# Patient Record
Sex: Female | Born: 1990 | Race: Black or African American | Hispanic: No | Marital: Single | Smoking: Never smoker
Health system: Southern US, Community
[De-identification: ages and names within clinical notes are randomized; demographics above are authoritative.]

## PROBLEM LIST (undated history)

## (undated) ENCOUNTER — Emergency Department (HOSPITAL_COMMUNITY): Admission: EM | Payer: Self-pay | Source: Home / Self Care

## (undated) ENCOUNTER — Emergency Department (HOSPITAL_COMMUNITY): Discharge status: Against Medical Advice | Payer: Self-pay

## (undated) DIAGNOSIS — Z973 Presence of spectacles and contact lenses: Secondary | ICD-10-CM

## (undated) DIAGNOSIS — L932 Other local lupus erythematosus: Secondary | ICD-10-CM

## (undated) HISTORY — DX: Presence of spectacles and contact lenses: Z97.3

---
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
## 2009-06-26 ENCOUNTER — Emergency Department (HOSPITAL_COMMUNITY): Admission: EM | Admit: 2009-06-26 | Discharge: 2009-06-26 | Payer: Self-pay | Admitting: Family Medicine

## 2011-12-23 ENCOUNTER — Encounter (HOSPITAL_COMMUNITY): Payer: Self-pay | Admitting: Emergency Medicine

## 2011-12-23 ENCOUNTER — Emergency Department (HOSPITAL_COMMUNITY)
Admission: EM | Admit: 2011-12-23 | Discharge: 2011-12-24 | Disposition: A | Discharge status: Home / Self Care | Payer: Self-pay | Attending: Emergency Medicine | Admitting: Emergency Medicine

## 2011-12-23 DIAGNOSIS — R200 Anesthesia of skin: Secondary | ICD-10-CM

## 2011-12-23 DIAGNOSIS — R209 Unspecified disturbances of skin sensation: Secondary | ICD-10-CM | POA: Insufficient documentation

## 2011-12-23 HISTORY — DX: Other local lupus erythematosus: L93.2

## 2011-12-23 NOTE — ED Provider Notes (Signed)
History     CSN: [id number]  Arrival date & time 12/23/11  2105   First MD Initiated Contact with Patient 12/23/11 2232      Chief Complaint  Patient presents with  . Numbness    (Consider location/radiation/quality/duration/timing/severity/associated sxs/prior treatment) HPI Patient presents emergency Dept. with a small patch of numbness to her medial calf.  Patient, states that last night she felt like her foot fell asleep and since that time she states she has had some intermittent pain, but this time has no pain. The patient states that the area feels swollen. The patient denies redness, calf pain, N/V, weakness, abd pain, CP, or SOB. Past Medical History  Diagnosis Date  . Cutaneous lupus erythematosus     History reviewed. No pertinent past surgical history.  No family history on file.  History  Substance Use Topics  . Smoking status: Never Smoker   . Smokeless tobacco: Not on file  . Alcohol Use: Yes    OB History    Grav Para Term Preterm Abortions TAB SAB Ect Mult Living                  Review of Systems All other systems negative except as documented in the HPI. All pertinent positives and negatives as reviewed in the HPI.  Allergies  Review of patient's allergies indicates no known allergies.  Home Medications   Current Outpatient Rx  Name Route Sig Dispense Refill  . HYDROXYCHLOROQUINE SULFATE 200 MG PO TABS Oral Take by mouth daily.    Marland Kitchen MEDROXYPROGESTERONE ACETATE 150 MG/ML IM SUSP Intramuscular Inject 150 mg into the muscle every 3 (three) months.      BP 130/82  Pulse 89  Temp(Src) 98.4 F (36.9 C) (Oral)  Resp 16  Wt 160 lb (72.576 kg)  SpO2 100%  Physical Exam  Constitutional: She appears well-developed and well-nourished.  Cardiovascular: Normal rate, regular rhythm and normal heart sounds.   Pulmonary/Chest: Effort normal and breath sounds normal.  Musculoskeletal:       Right lower leg: She exhibits no tenderness, no swelling, no  edema and no deformity.       Legs: Skin: Skin is warm and dry.    ED Course  Procedures (including critical care time) Patient does not have any signs of DVT on exam.  There is a small patch of numbness in the medial lower leg.The patient has no gait disturbance, weakness, or back pain.  This a peripheral nerve issue rather than a central issue.    MDM          Carlyle Dolly, PA-C 12/23/11 2354

## 2011-12-23 NOTE — Discharge Instructions (Signed)
This numbness will most likely resolve. If not you can follow up with the neurologist provided.  Return here as needed.

## 2011-12-23 NOTE — ED Notes (Signed)
Per pt: right foot fell asleep last night and pt has had intermittent pain and numbness to right calf which appears slightly swollen per pt. No changes in breathing or chest pain. No known activity or injury cause. Pt on Depo shots. Ambulating wnl.

## 2011-12-24 NOTE — ED Provider Notes (Signed)
Medical screening examination/treatment/procedure(s) were performed by non-physician practitioner and as supervising physician I was immediately available for consultation/collaboration.   Lyanne Co, MD 12/24/11 0020

## 2012-12-16 ENCOUNTER — Other Ambulatory Visit (HOSPITAL_COMMUNITY)
Admission: RE | Admit: 2012-12-16 | Discharge: 2012-12-16 | Disposition: A | Discharge status: Home / Self Care | Payer: Self-pay | Source: Ambulatory Visit | Attending: Medical | Admitting: Medical

## 2012-12-16 ENCOUNTER — Encounter: Payer: Self-pay | Admitting: Medical

## 2012-12-16 ENCOUNTER — Ambulatory Visit (INDEPENDENT_AMBULATORY_CARE_PROVIDER_SITE_OTHER): Payer: BC Managed Care – PPO | Admitting: Medical

## 2012-12-16 ENCOUNTER — Telehealth: Payer: Self-pay | Admitting: Family Medicine

## 2012-12-16 VITALS — BP 100/80 | HR 80 | Temp 98.2°F | Resp 16 | Ht 61.0 in | Wt 163.0 lb

## 2012-12-16 DIAGNOSIS — Z01419 Encounter for gynecological examination (general) (routine) without abnormal findings: Secondary | ICD-10-CM | POA: Insufficient documentation

## 2012-12-16 DIAGNOSIS — Z23 Encounter for immunization: Secondary | ICD-10-CM

## 2012-12-16 DIAGNOSIS — Z0000 Encounter for general adult medical examination without abnormal findings: Secondary | ICD-10-CM

## 2012-12-16 DIAGNOSIS — Z113 Encounter for screening for infections with a predominantly sexual mode of transmission: Secondary | ICD-10-CM | POA: Insufficient documentation

## 2012-12-16 DIAGNOSIS — Z309 Encounter for contraceptive management, unspecified: Secondary | ICD-10-CM

## 2012-12-16 DIAGNOSIS — Z124 Encounter for screening for malignant neoplasm of cervix: Secondary | ICD-10-CM

## 2012-12-16 LAB — CBC WITH DIFFERENTIAL/PLATELET
Basophils Absolute: 0 10*3/uL (ref 0.0–0.1)
Basophils Relative: 0 % (ref 0–1)
HCT: 40.6 % (ref 36.0–46.0)
Lymphocytes Relative: 43 % (ref 12–46)
MCHC: 34.5 g/dL (ref 30.0–36.0)
Monocytes Absolute: 0.5 10*3/uL (ref 0.1–1.0)
Neutro Abs: 3.7 10*3/uL (ref 1.7–7.7)
Neutrophils Relative %: 49 % (ref 43–77)
RDW: 14 % (ref 11.5–15.5)
WBC: 7.4 10*3/uL (ref 4.0–10.5)

## 2012-12-16 LAB — POCT URINE PREGNANCY: Preg Test, Ur: NEGATIVE

## 2012-12-16 LAB — POCT URINALYSIS DIPSTICK
Bilirubin, UA: NEGATIVE
Glucose, UA: NEGATIVE
Ketones, UA: NEGATIVE
pH, UA: 5

## 2012-12-16 MED ORDER — MEDROXYPROGESTERONE ACETATE 150 MG/ML IM SUSP
150.0000 mg | Freq: Once | INTRAMUSCULAR | Status: AC
  Administered 2012-12-16: 150 mg via INTRAMUSCULAR

## 2012-12-16 NOTE — Progress Notes (Signed)
Subjective:   HPI  Amanda Wolfe is a 22 y.o. female who presents for a complete physical.  New patient today.   Healthy feeling, no major issues.   Preventative care: Last ophthalmology visit:yes- Dr. Broadus John Last dental visit:n/a Last colonoscopy:n/a Last mammogram:n/a Last gynecological exam:may 2013 Last EKG:n/a Last labs:n/a  Prior vaccinations: TD or Tdap:2009 Influenza:n/a Pneumococcal:n/a Shingles/Zostavax:n/a Advanced directive:n/a Health care power of attorney:n/a  Concerns: Pap last 5/13, normal per pt.  Sexually active, 1 partner, monogamous, is agreeable to STD screening today.   Has hx/o 1 HPV vaccine.  Was seeing health dept prior.  Wants to c/t Depo Provera for now, but is considering getting off OCPs for a while.   Reviewed their medical, surgical, family, social, medication, and allergy history and updated chart as appropriate.   Past Medical History  Diagnosis Date  . Cutaneous lupus erythematosus     Dr. Terri Piedra, Dermatology  . Wears contact lenses     No past surgical history on file.  Family History  Problem Relation Age of Onset  . Hypertension Father   . Stroke Father   . Diabetes Maternal Grandfather   . Heart disease Neg Hx     History   Social History  . Marital Status: Single    Spouse Name: N/A    Number of Children: N/A  . Years of Education: N/A   Occupational History  . Not on file.   Social History Main Topics  . Smoking status: Never Smoker   . Smokeless tobacco: Not on file  . Alcohol Use: Yes     Comment: occasional  . Drug Use: No  . Sexually Active: Not Currently   Other Topics Concern  . Not on file   Social History Narrative   Lives with mother , brother, sister, works at [employer], in school at [employer], plans to attend [employer] for social work, exercises 3 days per week, walking, running    Current Outpatient Prescriptions on File Prior to Visit  Medication Sig Dispense Refill  . hydroxychloroquine  (PLAQUENIL) 200 MG tablet Take by mouth daily.      . medroxyPROGESTERone (DEPO-PROVERA) 150 MG/ML injection Inject 150 mg into the muscle every 3 (three) months.       No current facility-administered medications on file prior to visit.    No Known Allergies   Review of Systems Constitutional: -fever, -chills, -sweats, -unexpected weight change, -decreased appetite, -fatigue Allergy: -sneezing, -itching, -congestion Dermatology: -changing moles, --rash, -lumps ENT: -runny nose, -ear pain, -sore throat, -hoarseness, -sinus pain, -teeth pain, - ringing in ears, -hearing loss, -nosebleeds Cardiology: -chest pain, -palpitations, -swelling, -difficulty breathing when lying flat, -waking up short of breath Respiratory: -cough, -shortness of breath, -difficulty breathing with exercise or exertion, -wheezing, -coughing up blood Gastroenterology: -abdominal pain, -nausea, -vomiting, -diarrhea, -constipation, -blood in stool, -changes in bowel movement, -difficulty swallowing or eating Hematology: -bleeding, -bruising  Musculoskeletal: -joint aches, -muscle aches, -joint swelling, -back pain, -neck pain, -cramping, -changes in gait Ophthalmology: denies vision changes, eye redness, itching, discharge Urology: -burning with urination, -difficulty urinating, -blood in urine, -urinary frequency, -urgency, -incontinence Neurology: -headache, -weakness, -tingling, -numbness, -memory loss, -falls, -dizziness Psychology: -depressed mood, -agitation, -sleep problems     Objective:   Physical Exam  Nurse notes and vitals reviewed   General appearance: alert, no distress, WD/WN, AA female Skin: right cheek with irregular shape pink coloration different from surrounding skin - pt notes is her cutaneous lupus rash HEENT: normocephalic, conjunctiva/corneas normal, sclerae anicteric, PERRLA, EOMi,  nares patent, no discharge or erythema, pharynx normal Oral cavity: MMM, tongue normal, teeth in good  repair Neck: supple, no lymphadenopathy, no thyromegaly, no masses, normal ROM, no bruits Chest: non tender, normal shape and expansion Heart: RRR, normal S1, S2, no murmurs Lungs: CTA bilaterally, no wheezes, rhonchi, or rales Abdomen: +bs, soft, non tender, non distended, no masses, no hepatomegaly, no splenomegaly, no bruits Back: non tender, normal ROM, no scoliosis Musculoskeletal: upper extremities non tender, no obvious deformity, normal ROM throughout, lower extremities non tender, no obvious deformity, normal ROM throughout Extremities: no edema, no cyanosis, no clubbing Pulses: 2+ symmetric, upper and lower extremities, normal cap refill Neurological: alert, oriented x 3, CN2-12 intact, strength normal upper extremities and lower extremities, sensation normal throughout, DTRs 2+ throughout, no cerebellar signs, gait normal Psychiatric: normal affect, behavior normal, pleasant  Breast: nontender, no masses or lumps, no skin changes, no nipple discharge or inversion, no axillary lymphadenopathy Gyn: Normal external genitalia without lesions, vagina with normal mucosa, cervix without lesions, no cervical motion tenderness, no abnormal vaginal discharge.  Uterus and adnexa not enlarged, nontender, no masses.  Pap performed.  Exam chaperoned by nurse. Rectal: deferred    Assessment and Plan :    Encounter Diagnoses  Name Primary?  . Routine general medical examination at a health care facility Yes  . Need for HPV vaccination   . Contraception management   . Screen for STD (sexually transmitted disease)   . Screening for cervical cancer     Physical exam - discussed healthy lifestyle, diet, exercise, preventative care, vaccinations, and addressed their concerns.  Handout given.  HPV vaccine, counseling and VIS given  Contraception management - c/t Depo Provera for now.  Discussed options, pros/cons of medication.   Discussed safe sex, condom use.  STD screening today  Pap  sent, will request prior pap through Health Dept.  Follow-up pending labs

## 2012-12-16 NOTE — Patient Instructions (Signed)

## 2012-12-16 NOTE — Addendum Note (Signed)
Addended by: Janeice Robinson on: 12/16/2012 01:45 PM   Modules accepted: Orders

## 2012-12-16 NOTE — Telephone Encounter (Signed)
Message copied by Janeice Robinson on Mon Dec 16, 2012  3:56 PM ------      Message from: Aleen Campi, DAVID S      Created: Mon Dec 16, 2012 11:37 AM       pls give info for dentist            Dr. Yancey Flemings, dentist      [address], [place], [place] [address]      [phone]      [email or web address]                   ------

## 2012-12-16 NOTE — Telephone Encounter (Signed)
I mailed the patient a copy of the dentist information. CLS

## 2012-12-17 LAB — COMPREHENSIVE METABOLIC PANEL
ALT: 20 U/L (ref 0–35)
AST: 16 U/L (ref 0–37)
Albumin: 4.8 g/dL (ref 3.5–5.2)
BUN: 10 mg/dL (ref 6–23)
CO2: 23 mEq/L (ref 19–32)
Calcium: 10.1 mg/dL (ref 8.4–10.5)
Chloride: 107 mEq/L (ref 96–112)
Creat: 0.85 mg/dL (ref 0.50–1.10)
Potassium: 4 mEq/L (ref 3.5–5.3)

## 2012-12-17 LAB — LIPID PANEL
Cholesterol: 122 mg/dL (ref 0–200)
HDL: 42 mg/dL (ref >39)
Triglycerides: 56 mg/dL (ref <150)

## 2012-12-17 LAB — TSH: TSH: 2.412 u[IU]/mL (ref 0.350–4.500)

## 2012-12-18 ENCOUNTER — Encounter: Payer: Self-pay | Admitting: Family Medicine

## 2012-12-18 LAB — URINE CULTURE
Colony Count: NO GROWTH
Organism ID, Bacteria: NO GROWTH

## 2012-12-20 ENCOUNTER — Encounter: Payer: Self-pay | Admitting: Family Medicine

## 2012-12-27 ENCOUNTER — Telehealth: Payer: Self-pay | Admitting: Medical

## 2012-12-27 ENCOUNTER — Other Ambulatory Visit: Payer: Self-pay | Admitting: Medical

## 2012-12-27 DIAGNOSIS — L932 Other local lupus erythematosus: Secondary | ICD-10-CM

## 2012-12-27 NOTE — Telephone Encounter (Signed)
That is fine, I put the order in as standing

## 2012-12-27 NOTE — Telephone Encounter (Signed)
CALLED PT TO ADVISE THAT SHE CAN HAVE TEST DONE IN OUR OFFICE. SHE WILL PLAN TO HAVE THIS DONE ON TUES 6/17

## 2012-12-31 ENCOUNTER — Other Ambulatory Visit (INDEPENDENT_AMBULATORY_CARE_PROVIDER_SITE_OTHER): Payer: BC Managed Care – PPO

## 2012-12-31 DIAGNOSIS — L932 Other local lupus erythematosus: Secondary | ICD-10-CM

## 2012-12-31 DIAGNOSIS — R82998 Other abnormal findings in urine: Secondary | ICD-10-CM

## 2012-12-31 DIAGNOSIS — R8290 Unspecified abnormal findings in urine: Secondary | ICD-10-CM

## 2012-12-31 LAB — POCT URINALYSIS DIPSTICK
Bilirubin, UA: NEGATIVE
Nitrite, UA: POSITIVE
pH, UA: 6

## 2013-01-01 LAB — URINE CULTURE

## 2013-01-01 LAB — ANA: Anti Nuclear Antibody(ANA): NEGATIVE

## 2013-01-02 ENCOUNTER — Other Ambulatory Visit: Payer: Self-pay | Admitting: Medical

## 2013-01-02 MED ORDER — CIPROFLOXACIN HCL 250 MG PO TABS: 250.0000 mg | ORAL_TABLET | Freq: Two times a day (BID) | ORAL | Status: DC

## 2013-02-05 ENCOUNTER — Telehealth: Payer: Self-pay | Admitting: Medical

## 2013-02-05 NOTE — Telephone Encounter (Signed)
Rcd copy of fax which is an out of work note.  This is not ours and our PA did not sign and pt was not here on that day.  Pt will be dismissed.  Copy scanned into media.  Kristian Covey PA is aware.

## 2013-02-05 NOTE — Telephone Encounter (Signed)
TSD  

## 2013-02-10 ENCOUNTER — Encounter: Payer: Self-pay | Admitting: Family Medicine

## 2014-12-22 ENCOUNTER — Other Ambulatory Visit: Payer: Self-pay | Admitting: Obstetrics & Gynecology

## 2015-08-04 ENCOUNTER — Other Ambulatory Visit: Payer: Self-pay | Admitting: Obstetrics and Gynecology

## 2017-04-03 ENCOUNTER — Ambulatory Visit (HOSPITAL_COMMUNITY)
Admission: EM | Admit: 2017-04-03 | Discharge: 2017-04-03 | Disposition: A | Discharge status: Home / Self Care | Payer: BLUE CROSS/BLUE SHIELD | Attending: Family Medicine | Admitting: Family Medicine

## 2017-04-03 ENCOUNTER — Encounter (HOSPITAL_COMMUNITY): Payer: Self-pay | Admitting: Emergency Medicine

## 2017-04-03 DIAGNOSIS — M79672 Pain in left foot: Secondary | ICD-10-CM

## 2017-04-03 MED ORDER — NAPROXEN 500 MG PO TABS: 500.0000 mg | ORAL_TABLET | Freq: Two times a day (BID) | ORAL | 0 refills | Status: DC

## 2017-04-03 NOTE — ED Provider Notes (Signed)
[facility]    CSN: [id number] Arrival date & time: 04/03/17  1539     History   Chief Complaint Chief Complaint  Patient presents with  . Foot Pain    HPI Amanda Wolfe is a 26 y.o. female.   26 year old female comes in for 2 week history of left foot pain. Patient states it started off mild, on the top of her foot, and was intermittent. Pain has slowly increased, and is now painful to bear weight. Noticed some swelling this morning. She has been using ice compress with good relief. Patient states she has had a recent change in activity, going back to school with lots of walking. 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 Denies injury, trauma, no numbness, tingling. Has not taken anything for the pain. She notices that the pain is worse first thing in the morning/after rest, and is improved from with some movement. Denies spreading erythema, increased warmth.      Past Medical History:  Diagnosis Date  . Cutaneous lupus erythematosus    Dr. Terri Piedra, Dermatology  . Wears contact lenses     There are no active problems to display for this patient.   History reviewed. No pertinent surgical history.  OB History    No data available       Home Medications    Prior to Admission medications   Medication Sig Start Date End Date Taking? Authorizing Provider  hydroxychloroquine (PLAQUENIL) 200 MG tablet Take by mouth daily.    [provider]  medroxyPROGESTERone (DEPO-PROVERA) 150 MG/ML injection Inject 150 mg into the muscle every 3 (three) months.    [provider]  naproxen (NAPROSYN) 500 MG tablet Take 1 tablet (500 mg total) by mouth 2 (two) times daily. 04/03/17   Belinda Fisher, PA-C    Family History Family History  Problem Relation Age of Onset  . Hypertension Father   . Stroke Father   . Diabetes Maternal Grandfather   . Heart disease Neg Hx     Social History Social History    Substance Use Topics  . Smoking status: Never Smoker  . Smokeless tobacco: Never Used  . Alcohol use Yes     Comment: occasional     Allergies   Patient has no known allergies.   Review of Systems Review of Systems  Reason unable to perform ROS: See HPI as above.     Physical Exam Triage Vital Signs ED Triage Vitals  Enc Vitals Group     BP 04/03/17 1615 115/79     Pulse Rate 04/03/17 1615 88     Resp 04/03/17 1615 16     Temp 04/03/17 1615 99 F (37.2 C)     Temp Source 04/03/17 1615 Oral     SpO2 04/03/17 1615 99 %     Weight 04/03/17 1614 185 lb (83.9 kg)     Height 04/03/17 1614  (1.499 m)     Head Circumference --      Peak Flow --      Pain Score 04/03/17 1615 4     Pain Loc --      Pain Edu? --      Excl. in GC? --    No data found.   Updated Vital Signs BP 115/79   Pulse 88   Temp 99 F (37.2 C) (Oral)   Resp 16   Ht  (1.499 m)  Wt 185 lb (83.9 kg)   SpO2 99%   BMI 37.37 kg/m    Physical Exam  Constitutional: She is oriented to person, place, and time. She appears well-developed and well-nourished. No distress.  HENT:  Head: Normocephalic and atraumatic.  Eyes: Pupils are equal, round, and reactive to light. Conjunctivae are normal.  Musculoskeletal:  No swelling, wound, erythema noted. No increased warmth. Tenderness on palpation of left dorsal foot, third to fourth proximal metatarsal. Extension of toes increases the pain. Full range of motion of ankle and toes. Strength normal and equal bilaterally. Sensation intact and equal bilaterally. Pedal pulses 2+ and equal bilaterally. Cap refill unable to assess due to nail polish.  Neurological: She is alert and oriented to person, place, and time.     UC Treatments / Results  Labs (all labs ordered are listed, but only abnormal results are displayed) Labs Reviewed - No data to display  EKG  EKG Interpretation None       Radiology No results  found.  Procedures Procedures (including critical care time)  Medications Ordered in UC Medications - No data to display   Initial Impression / Assessment and Plan / UC Course  I have reviewed the triage vital signs and the nursing notes.  Pertinent labs & imaging results that were available during my care of the patient were reviewed by me and considered in my medical decision making (see chart for details).    Discussed with patient symptoms could be due to tendinitis given increased activity. Symptomatic treatment. Return precautions given.   Final Clinical Impressions(s) / UC Diagnoses   Final diagnoses:  Foot pain, left    New Prescriptions New Prescriptions   NAPROXEN (NAPROSYN) 500 MG TABLET    Take 1 tablet (500 mg total) by mouth 2 (two) times daily.      Belinda Fisher, PA-C 04/03/17 [phone]

## 2017-04-03 NOTE — ED Triage Notes (Signed)
PT reports left foot pain increasing over last two weeks. No injury. Increased walking.

## 2017-04-03 NOTE — Discharge Instructions (Signed)
Start Naproxen as directed. Ice/heat compresses and elevation. Wear supportive shoes. This can take up to 3-4 weeks to completely resolve, but you should be feeling better each week. Follow up here or with PCP if symptoms worsen, changes for reevaluation.

## 2018-09-19 ENCOUNTER — Encounter (INDEPENDENT_AMBULATORY_CARE_PROVIDER_SITE_OTHER): Payer: BLUE CROSS/BLUE SHIELD | Admitting: Ophthalmology

## 2018-09-19 DIAGNOSIS — H5213 Myopia, bilateral: Secondary | ICD-10-CM

## 2018-09-19 DIAGNOSIS — H43813 Vitreous degeneration, bilateral: Secondary | ICD-10-CM | POA: Diagnosis not present

## 2018-09-19 DIAGNOSIS — H4421 Degenerative myopia, right eye: Secondary | ICD-10-CM | POA: Diagnosis not present

## 2018-11-07 ENCOUNTER — Encounter: Payer: Self-pay | Admitting: Medical

## 2020-05-20 ENCOUNTER — Other Ambulatory Visit: Payer: Self-pay

## 2020-05-20 ENCOUNTER — Emergency Department (HOSPITAL_COMMUNITY): Payer: BC Managed Care – PPO

## 2020-05-20 ENCOUNTER — Inpatient Hospital Stay (HOSPITAL_COMMUNITY)
Admission: EM | Admit: 2020-05-20 | Discharge: 2020-05-22 | DRG: 872 | Disposition: A | Discharge status: Home / Self Care | Payer: BC Managed Care – PPO | Attending: Internal Medicine | Admitting: Internal Medicine

## 2020-05-20 DIAGNOSIS — J0300 Acute streptococcal tonsillitis, unspecified: Secondary | ICD-10-CM | POA: Diagnosis present

## 2020-05-20 DIAGNOSIS — L930 Discoid lupus erythematosus: Secondary | ICD-10-CM | POA: Diagnosis present

## 2020-05-20 DIAGNOSIS — R6520 Severe sepsis without septic shock: Secondary | ICD-10-CM | POA: Diagnosis present

## 2020-05-20 DIAGNOSIS — A400 Sepsis due to streptococcus, group A: Secondary | ICD-10-CM | POA: Diagnosis not present

## 2020-05-20 DIAGNOSIS — D72829 Elevated white blood cell count, unspecified: Secondary | ICD-10-CM | POA: Diagnosis not present

## 2020-05-20 DIAGNOSIS — A419 Sepsis, unspecified organism: Secondary | ICD-10-CM | POA: Diagnosis present

## 2020-05-20 DIAGNOSIS — J039 Acute tonsillitis, unspecified: Secondary | ICD-10-CM | POA: Diagnosis not present

## 2020-05-20 DIAGNOSIS — Z20822 Contact with and (suspected) exposure to covid-19: Secondary | ICD-10-CM | POA: Diagnosis present

## 2020-05-20 DIAGNOSIS — Z6841 Body Mass Index (BMI) 40.0 and over, adult: Secondary | ICD-10-CM

## 2020-05-20 DIAGNOSIS — J020 Streptococcal pharyngitis: Secondary | ICD-10-CM

## 2020-05-20 DIAGNOSIS — R7989 Other specified abnormal findings of blood chemistry: Secondary | ICD-10-CM | POA: Diagnosis not present

## 2020-05-20 LAB — I-STAT BETA HCG BLOOD, ED (MC, WL, AP ONLY): I-stat hCG, quantitative: <5 m[IU]/mL (ref <5)

## 2020-05-20 LAB — COMPREHENSIVE METABOLIC PANEL
ALT: 42 U/L (ref 0–44)
AST: 31 U/L (ref 15–41)
Albumin: 4.6 g/dL (ref 3.5–5.0)
Alkaline Phosphatase: 58 U/L (ref 38–126)
Anion gap: 14 (ref 5–15)
BUN: 10 mg/dL (ref 6–20)
CO2: 23 mmol/L (ref 22–32)
Calcium: 9.1 mg/dL (ref 8.9–10.3)
Chloride: 98 mmol/L (ref 98–111)
Creatinine, Ser: 1.08 mg/dL — ABNORMAL HIGH (ref 0.44–1.00)
GFR, Estimated: >60 mL/min (ref >60)
Glucose, Bld: 132 mg/dL — ABNORMAL HIGH (ref 70–99)
Potassium: 3.6 mmol/L (ref 3.5–5.1)
Sodium: 135 mmol/L (ref 135–145)
Total Bilirubin: 3.6 mg/dL — ABNORMAL HIGH (ref 0.3–1.2)
Total Protein: 8.5 g/dL — ABNORMAL HIGH (ref 6.5–8.1)

## 2020-05-20 LAB — CBC WITH DIFFERENTIAL/PLATELET
Abs Immature Granulocytes: 0.11 10*3/uL — ABNORMAL HIGH (ref 0.00–0.07)
Basophils Absolute: 0 10*3/uL (ref 0.0–0.1)
Basophils Relative: 0 %
Eosinophils Absolute: 0 10*3/uL (ref 0.0–0.5)
Eosinophils Relative: 0 %
HCT: 41.8 % (ref 36.0–46.0)
Hemoglobin: 14.4 g/dL (ref 12.0–15.0)
Immature Granulocytes: 1 %
Lymphocytes Relative: 2 %
Lymphs Abs: 0.4 10*3/uL — ABNORMAL LOW (ref 0.7–4.0)
MCH: 29.8 pg (ref 26.0–34.0)
MCHC: 34.4 g/dL (ref 30.0–36.0)
MCV: 86.4 fL (ref 80.0–100.0)
Monocytes Absolute: 1.2 10*3/uL — ABNORMAL HIGH (ref 0.1–1.0)
Monocytes Relative: 7 %
Neutro Abs: 15.3 10*3/uL — ABNORMAL HIGH (ref 1.7–7.7)
Neutrophils Relative %: 90 %
Platelets: 247 10*3/uL (ref 150–400)
RBC: 4.84 MIL/uL (ref 3.87–5.11)
RDW: 13.2 % (ref 11.5–15.5)
WBC: 17 10*3/uL — ABNORMAL HIGH (ref 4.0–10.5)
nRBC: 0 % (ref 0.0–0.2)

## 2020-05-20 LAB — TROPONIN I (HIGH SENSITIVITY): Troponin I (High Sensitivity): 4 ng/L (ref <18)

## 2020-05-20 LAB — RESPIRATORY PANEL BY RT PCR (FLU A&B, COVID)
Influenza A by PCR: NEGATIVE
Influenza B by PCR: NEGATIVE
SARS Coronavirus 2 by RT PCR: NEGATIVE

## 2020-05-20 LAB — D-DIMER, QUANTITATIVE: D-Dimer, Quant: 1.12 ug/mL-FEU — ABNORMAL HIGH (ref 0.00–0.50)

## 2020-05-20 LAB — GROUP A STREP BY PCR: Group A Strep by PCR: DETECTED — AB

## 2020-05-20 MED ORDER — ZIPRASIDONE MESYLATE 20 MG IM SOLR
INTRAMUSCULAR | Status: AC
  Filled 2020-05-20: qty 20

## 2020-05-20 MED ORDER — SODIUM CHLORIDE 0.9 % IV SOLN
3.0000 g | Freq: Once | INTRAVENOUS | Status: AC
  Administered 2020-05-20: 3 g via INTRAVENOUS
  Filled 2020-05-20: qty 8

## 2020-05-20 MED ORDER — IOHEXOL 350 MG/ML SOLN
100.0000 mL | Freq: Once | INTRAVENOUS | Status: AC | PRN
  Administered 2020-05-20: 75 mL via INTRAVENOUS

## 2020-05-20 MED ORDER — ONDANSETRON HCL 4 MG/2ML IJ SOLN: 4.0000 mg | Freq: Four times a day (QID) | INTRAMUSCULAR | Status: DC | PRN

## 2020-05-20 MED ORDER — ACETAMINOPHEN 325 MG PO TABS
650.0000 mg | ORAL_TABLET | Freq: Four times a day (QID) | ORAL | Status: DC | PRN
  Administered 2020-05-21 – 2020-05-22 (×4): 650 mg via ORAL
  Filled 2020-05-20 (×4): qty 2

## 2020-05-20 MED ORDER — ONDANSETRON HCL 4 MG/2ML IJ SOLN
4.0000 mg | Freq: Once | INTRAMUSCULAR | Status: AC
  Administered 2020-05-20: 4 mg via INTRAVENOUS
  Filled 2020-05-20: qty 2

## 2020-05-20 MED ORDER — SODIUM CHLORIDE 0.9 % IV SOLN: Freq: Once | INTRAVENOUS | Status: AC

## 2020-05-20 MED ORDER — ENOXAPARIN SODIUM 40 MG/0.4ML ~~LOC~~ SOLN
40.0000 mg | SUBCUTANEOUS | Status: DC
  Administered 2020-05-21 (×2): 40 mg via SUBCUTANEOUS
  Filled 2020-05-20 (×2): qty 0.4

## 2020-05-20 MED ORDER — ACETAMINOPHEN 325 MG PO TABS: 650.0000 mg | ORAL_TABLET | Freq: Once | ORAL | Status: DC | PRN

## 2020-05-20 MED ORDER — SODIUM CHLORIDE 0.9 % IV SOLN
3.0000 g | Freq: Four times a day (QID) | INTRAVENOUS | Status: DC
  Filled 2020-05-20: qty 8

## 2020-05-20 MED ORDER — SODIUM CHLORIDE 0.9 % IV BOLUS
1000.0000 mL | Freq: Once | INTRAVENOUS | Status: AC
  Administered 2020-05-20: 1000 mL via INTRAVENOUS

## 2020-05-20 MED ORDER — ACETAMINOPHEN 650 MG RE SUPP
650.0000 mg | Freq: Four times a day (QID) | RECTAL | Status: DC | PRN
  Filled 2020-05-20: qty 1

## 2020-05-20 MED ORDER — ACETAMINOPHEN 500 MG PO TABS
1000.0000 mg | ORAL_TABLET | Freq: Once | ORAL | Status: AC
  Administered 2020-05-20: 1000 mg via ORAL
  Filled 2020-05-20: qty 2

## 2020-05-20 MED ORDER — IBUPROFEN 800 MG PO TABS
800.0000 mg | ORAL_TABLET | Freq: Once | ORAL | Status: AC
  Administered 2020-05-20: 800 mg via ORAL
  Filled 2020-05-20: qty 1

## 2020-05-20 MED ORDER — ONDANSETRON HCL 4 MG PO TABS
4.0000 mg | ORAL_TABLET | Freq: Four times a day (QID) | ORAL | Status: DC | PRN
  Administered 2020-05-21 (×2): 4 mg via ORAL
  Filled 2020-05-20 (×2): qty 1

## 2020-05-20 MED ORDER — SODIUM CHLORIDE 0.9 % IV SOLN: INTRAVENOUS | Status: AC

## 2020-05-20 MED ORDER — METOCLOPRAMIDE HCL 5 MG/ML IJ SOLN
10.0000 mg | Freq: Once | INTRAMUSCULAR | Status: AC
  Administered 2020-05-20: 10 mg via INTRAVENOUS
  Filled 2020-05-20: qty 2

## 2020-05-20 MED ORDER — OXYCODONE HCL 5 MG PO TABS
5.0000 mg | ORAL_TABLET | ORAL | Status: DC | PRN
  Administered 2020-05-21 (×2): 5 mg via ORAL
  Filled 2020-05-20 (×2): qty 1

## 2020-05-20 NOTE — Progress Notes (Signed)
Pharmacy Antibiotic Note  Amanda Wolfe is a 29 y.o. female admitted on 05/20/2020 with sore throat, N/V and fever.  Pharmacy has been consulted for unasyn for severe tonsillitis. CrCl > 157mls/min  Plan: unasyn 3gm IV q6h Pharmacy will sign off as renal adjustment unlikely, will follow peripherally     Temp (24hrs), Avg:101.5 F (38.6 C), Min:99.7 F (37.6 C), Max:103.2 F (39.6 C)  Recent Labs  Lab 05/20/20 1740  WBC 17.0*  CREATININE 1.08*    CrCl cannot be calculated (Unknown ideal weight.).    Allergies  Allergen Reactions  . Sulfa Antibiotics      Thank you for allowing pharmacy to be a part of this patient's care.  Arley Phenix RPh 05/20/2020, 11:55 PM

## 2020-05-20 NOTE — ED Provider Notes (Signed)
[facility]-EMERGENCY DEPT Provider Note   CSN: [id number] Arrival date & time: 05/20/20  1635     History No chief complaint on file.   Amanda Wolfe is a 29 y.o. female.  Presents to ER with concern for sore throat, nausea and vomiting and fever.  Reports that symptoms ongoing for the last few days, initially sore throat but then started having chills, felt feverish.  Also having nausea.  Significant pain with swallowing.  Also feeling somewhat short of breath, occasional chest discomfort.  No chest pain at present.  Denies any chronic medical problems.  Does report remote history of strep throat.    HPI     Past Medical History:  Diagnosis Date  . Cutaneous lupus erythematosus    Dr. Terri Piedra, Dermatology  . Wears contact lenses     Patient Active Problem List   Diagnosis Date Noted  . Acute tonsillitis 05/20/2020  . Sepsis (HCC) 05/20/2020  . Hyperbilirubinemia 05/20/2020    No past surgical history on file.   OB History   No obstetric history on file.     Family History  Problem Relation Age of Onset  . Hypertension Father   . Stroke Father   . Diabetes Maternal Grandfather   . Heart disease Neg Hx     Social History   Tobacco Use  . Smoking status: Never Smoker  . Smokeless tobacco: Never Used  Substance Use Topics  . Alcohol use: Yes    Comment: occasional  . Drug use: No    Home Medications Prior to Admission medications   Medication Sig Start Date End Date Taking? Authorizing Provider  aspirin-sod bicarb-citric acid (ALKA-SELTZER) 325 MG TBEF tablet Take 325 mg by mouth every 6 (six) hours as needed (cold symptoms).   Yes [provider]  DM-Doxylamine-Acetaminophen (NYQUIL COLD & FLU PO) Take 30 mLs by mouth at bedtime as needed (cold symptoms).   Yes [provider]  naproxen (NAPROSYN) 500 MG tablet Take 1 tablet (500 mg total) by mouth 2 (two) times daily. Patient not taking: Reported on 05/20/2020  04/03/17   Belinda Fisher, PA-C    Allergies    Sulfa antibiotics  Review of Systems   Review of Systems  Constitutional: Positive for chills, fatigue and fever.  HENT: Positive for sore throat and trouble swallowing. Negative for ear pain.   Eyes: Negative for pain and visual disturbance.  Respiratory: Negative for cough and shortness of breath.   Cardiovascular: Negative for chest pain and palpitations.  Gastrointestinal: Negative for abdominal pain and vomiting.  Genitourinary: Negative for dysuria and hematuria.  Musculoskeletal: Negative for arthralgias and back pain.  Skin: Negative for color change and rash.  Neurological: Negative for seizures and syncope.  All other systems reviewed and are negative.   Physical Exam Updated Vital Signs BP 107/77   Pulse (!) 110   Temp 99.7 F (37.6 C) (Oral)   Resp 15   SpO2 95%   Physical Exam Vitals and nursing note reviewed.  Constitutional:      General: She is not in acute distress.    Appearance: She is well-developed.  HENT:     Head: Normocephalic and atraumatic.     Mouth/Throat:     Comments: Erythema to posterior oropharynx, some of enlarged tonsils, tonsillar exudate present Eyes:     Conjunctiva/sclera: Conjunctivae normal.  Cardiovascular:     Rate and Rhythm: Regular rhythm. Tachycardia present.     Heart sounds: No murmur heard.  Pulmonary:     Effort: Pulmonary effort is normal. No respiratory distress.     Breath sounds: Normal breath sounds.  Abdominal:     Palpations: Abdomen is soft.     Tenderness: There is no abdominal tenderness.  Musculoskeletal:        General: No deformity or signs of injury.     Cervical back: Neck supple. No rigidity.  Skin:    General: Skin is warm and dry.     Capillary Refill: Capillary refill takes less than 2 seconds.  Neurological:     General: No focal deficit present.     Mental Status: She is alert and oriented to person, place, and time.  Psychiatric:        Mood  and Affect: Mood normal.        Thought Content: Thought content normal.     ED Results / Procedures / Treatments   Labs (all labs ordered are listed, but only abnormal results are displayed) Labs Reviewed  GROUP A STREP BY PCR - Abnormal; Notable for the following components:      Result Value   Group A Strep by PCR DETECTED (*)    All other components within normal limits  CBC WITH DIFFERENTIAL/PLATELET - Abnormal; Notable for the following components:   WBC 17.0 (*)    Neutro Abs 15.3 (*)    Lymphs Abs 0.4 (*)    Monocytes Absolute 1.2 (*)    Abs Immature Granulocytes 0.11 (*)    All other components within normal limits  COMPREHENSIVE METABOLIC PANEL - Abnormal; Notable for the following components:   Glucose, Bld 132 (*)    Creatinine, Ser 1.08 (*)    Total Protein 8.5 (*)    Total Bilirubin 3.6 (*)    All other components within normal limits  URINALYSIS, ROUTINE W REFLEX MICROSCOPIC - Abnormal; Notable for the following components:   APPearance HAZY (*)    Specific Gravity, Urine 1.042 (*)    Ketones, ur 5 (*)    Leukocytes,Ua TRACE (*)    All other components within normal limits  D-DIMER, QUANTITATIVE (NOT AT [facility]) - Abnormal; Notable for the following components:   D-Dimer, Quant 1.12 (*)    All other components within normal limits  RESPIRATORY PANEL BY RT PCR (FLU A&B, COVID)  CULTURE, BLOOD (ROUTINE X 2)  CULTURE, BLOOD (ROUTINE X 2)  HIV ANTIBODY (ROUTINE TESTING W REFLEX)  LACTIC ACID, PLASMA  LACTIC ACID, PLASMA  PROTIME-INR  APTT  COMPREHENSIVE METABOLIC PANEL  CBC  I-STAT BETA HCG BLOOD, ED ([facility], [facility], [facility] ONLY)  TROPONIN I (HIGH SENSITIVITY)    EKG None  Radiology CT Soft Tissue Neck W Contrast  Result Date: 05/20/2020 CLINICAL DATA:  Initial evaluation for acute severe sore throat, neck swelling, concern for abscess. EXAM: CT NECK WITH CONTRAST TECHNIQUE: Multidetector CT imaging of the neck was performed using the standard protocol following the  bolus administration of intravenous contrast. CONTRAST:  44mL OMNIPAQUE IOHEXOL 350 MG/ML SOLN COMPARISON:  None available. FINDINGS: Pharynx and larynx: Oral cavity within normal limits. No acute inflammatory changes seen about the dentition. Palatine tonsils are enlarged and somewhat hyperenhancing bilaterally, suggesting acute tonsillitis. No discrete tonsillar or peritonsillar abscess. Adenoidal soft tissues somewhat prominent and enhancing as well. Mild mucosal edema noted within the adjacent oropharynx. Supraglottic airway remains patent at this time. No retropharyngeal collection or swelling. Epiglottis itself is within normal limits. Remainder of the hypopharynx and supraglottic larynx within normal limits. Glottis normal. Subglottic  airway widely patent and clear. Salivary glands: Salivary glands including the parotid and submandibular glands are within normal limits. Thyroid: Thyroid normal. Lymph nodes: Mildly prominent bilateral level II lymph nodes measure up to 1.4 cm, likely reactive. No other enlarged or pathologic adenopathy within the neck. Vascular: Normal intravascular enhancement seen throughout the neck. Aberrant right subclavian artery noted. Limited intracranial: Probable empty sella noted. Visualized brain and posterior fossa otherwise unremarkable. Visualized orbits: Visualized globes and orbital soft tissues demonstrate no acute finding. Asymmetric axial myopia noted at the right globe. Mastoids and visualized paranasal sinuses: Few small retention cyst noted within the left maxillary sinus. Visualized paranasal sinuses are otherwise clear. Visualize mastoids and middle ear cavities are well pneumatized and free fluid. Skeleton: No acute osseous finding. No discrete or worrisome osseous lesions. Upper chest: Visualized upper chest demonstrates no acute finding. Other: None. IMPRESSION: 1. Findings consistent with acute tonsillitis/pharyngitis as above. No discrete tonsillar or  peritonsillar abscess. 2. Mildly prominent bilateral level II lymph nodes, likely reactive. 3. Incidental aberrant right subclavian artery. 4. Suspected empty sella. While this finding is often incidental in nature and of no clinical significance, this can also be seen in the setting of idiopathic intracranial hypertension. Electronically Signed   By: Rise MuBenjamin  McClintock M.D.   On: 05/20/2020 20:29   CT Angio Chest PE W and/or Wo Contrast  Result Date: 05/20/2020 CLINICAL DATA:  Shortness of breath and tachycardia EXAM: CT ANGIOGRAPHY CHEST WITH CONTRAST TECHNIQUE: Multidetector CT imaging of the chest was performed using the standard protocol during bolus administration of intravenous contrast. Multiplanar CT image reconstructions and MIPs were obtained to evaluate the vascular anatomy. CONTRAST:  75mL OMNIPAQUE IOHEXOL 350 MG/ML SOLN COMPARISON:  None. FINDINGS: Cardiovascular: Aberrant right subclavian artery is noted. Thoracic aorta is otherwise within normal limits. No cardiac enlargement is noted. No coronary calcifications are seen. Pulmonary artery shows no large central pulmonary embolus. The peripheral branches are somewhat limited due to the timing of the contrast bolus. Mediastinum/Nodes: Thoracic inlet is within normal limits. No hilar or mediastinal adenopathy is noted. The esophagus is within normal limits. Lungs/Pleura: Lungs are hypoinflated similar to that seen on prior chest x-ray. No focal infiltrate or effusion is seen. No pneumothorax is noted. Upper Abdomen: Fatty infiltration of the liver is noted. No other focal abnormality in the upper abdomen is seen. Musculoskeletal: No acute bony abnormality is noted. Review of the MIP images confirms the above findings. IMPRESSION: No evidence of pulmonary embolism is noted. Aberrant right subclavian artery. Hypoinflation of the lung similar to that seen on prior plain film although no focal infiltrate is seen. Fatty liver. Electronically Signed    By: Alcide CleverMark  Lukens M.D.   On: 05/20/2020 20:20   DG Chest Portable 1 View  Result Date: 05/20/2020 CLINICAL DATA:  COVID symptoms, fever. EXAM: PORTABLE CHEST 1 VIEW COMPARISON:  None. FINDINGS: The heart size and mediastinal contours are within normal limits. The lung volumes are low. No focal consolidation, pleural effusion, or pneumothorax. The visualized skeletal structures are unremarkable. IMPRESSION: No active disease. Electronically Signed   By: Romona Curlsyler  Litton M.D.   On: 05/20/2020 17:49    Procedures Procedures (including critical care time)  Medications Ordered in ED Medications  enoxaparin (LOVENOX) injection 40 mg (has no administration in time range)  0.9 %  sodium chloride infusion (has no administration in time range)  acetaminophen (TYLENOL) tablet 650 mg (has no administration in time range)    Or  acetaminophen (TYLENOL) suppository 650 mg (  has no administration in time range)  oxyCODONE (Oxy IR/ROXICODONE) immediate release tablet 5 mg (has no administration in time range)  ondansetron (ZOFRAN) tablet 4 mg (has no administration in time range)    Or  ondansetron (ZOFRAN) injection 4 mg (has no administration in time range)  Ampicillin-Sulbactam (UNASYN) 3 g in sodium chloride 0.9 % 100 mL IVPB (has no administration in time range)  acetaminophen (TYLENOL) tablet 1,000 mg (1,000 mg Oral Given 05/20/20 1737)  sodium chloride 0.9 % bolus 1,000 mL (0 mLs Intravenous Stopped 05/20/20 2213)  ibuprofen (ADVIL) tablet 800 mg (800 mg Oral Given 05/20/20 1902)  ondansetron (ZOFRAN) injection 4 mg (4 mg Intravenous Given 05/20/20 1902)  iohexol (OMNIPAQUE) 350 MG/ML injection 100 mL (75 mLs Intravenous Contrast Given 05/20/20 2007)  sodium chloride 0.9 % bolus 1,000 mL (0 mLs Intravenous Stopped 05/20/20 2312)  Ampicillin-Sulbactam (UNASYN) 3 g in sodium chloride 0.9 % 100 mL IVPB (0 g Intravenous Stopped 05/20/20 2213)  metoCLOPramide (REGLAN) injection 10 mg (10 mg Intravenous Given  05/20/20 2322)  0.9 %  sodium chloride infusion ( Intravenous New Bag/Given 05/20/20 2324)    ED Course  I have reviewed the triage vital signs and the nursing notes.  Pertinent labs & imaging results that were available during my care of the patient were reviewed by me and considered in my medical decision making (see chart for details).    MDM Rules/Calculators/A&P                         29 year old lady presenting to the emergency room with concern for sore throat, fever, shortness of breath.  On exam, patient noted to be profoundly tachycardic, borderline blood pressure, febrile.  Obtained labs, strep, Covid, dimer.  Strep positive, profound leukocytosis.  Dimer elevated.  CTA chest was negative for pulmonary embolism.  CT neck ordered to further assess, rule out abscess.  No PTA.  Consistent with severe tonsillitis, pharyngitis.  Patient having persistent pain, nausea, significant tachycardia.  Given these findings, leukocytosis, believe patient would benefit from IV antibiotics and further observation.  Started Unasyn.  Consulted hospitalist service for admission.  Final Clinical Impression(s) / ED Diagnoses Final diagnoses:  Strep pharyngitis  Leukocytosis, unspecified type  Tonsillitis    Rx / DC Orders ED Discharge Orders    None       Milagros Loll, MD 05/21/20 0030

## 2020-05-20 NOTE — ED Triage Notes (Signed)
Pt states that for several days she has had sore throat, N/V, and fever. Tachycardic. Alert and oriented.

## 2020-05-20 NOTE — H&P (Signed)
[facility] History and Physical  Solymar Grace OVF:[id number] DOB: Nov 14, 1990 DOA: 05/20/2020   PCP: [facility]  Specialists: None  Chief Complaint: Throat pain fever ongoing for 3 days  HPI: Amanda Wolfe is a 28 y.o. female with no significant past medical history who was in her usual state of health till about 3 days ago when she started developing pain in her throat.  She had fever with chills.  She felt nauseated.  She has had episodes of vomiting.  Denies any abdominal pain.  Denies any chest pain.  She has some palpitations.  Some difficulty breathing.  No diarrhea.  No dysuria.  No vaginal discharge.  Denies any sick contacts.  She has had difficulty and pain with swallowing.  In the ED she was noted to have leukocytosis.  She was noted to be tachycardic and febrile.  Evaluation revealed severe tonsillitis without any abscess.  She will need hospitalization for IV antibiotics.  Home Medications: Prior to Admission medications   Medication Sig Start Date End Date Taking? Authorizing Provider  aspirin-sod bicarb-citric acid (ALKA-SELTZER) 325 MG TBEF tablet Take 325 mg by mouth every 6 (six) hours as needed (cold symptoms).   Yes [provider]  DM-Doxylamine-Acetaminophen (NYQUIL COLD & FLU PO) Take 30 mLs by mouth at bedtime as needed (cold symptoms).   Yes [provider]  naproxen (NAPROSYN) 500 MG tablet Take 1 tablet (500 mg total) by mouth 2 (two) times daily. Patient not taking: Reported on 05/20/2020 04/03/17   Belinda Fisher, PA-C    Allergies:  Allergies  Allergen Reactions  . Sulfa Antibiotics     Past Medical History: Past Medical History:  Diagnosis Date  . Cutaneous lupus erythematosus    Dr. Terri Piedra, Dermatology  . Wears contact lenses     No past surgical history on file.  Social History: Lives in [place].  Denies any history of smoking alcohol use illicit drug use.  Family History:  Family History   Problem Relation Age of Onset  . Hypertension Father   . Stroke Father   . Diabetes Maternal Grandfather   . Heart disease Neg Hx      Review of Systems - History obtained from the patient General ROS: positive for  - chills, fatigue and fever Psychological ROS: negative Ophthalmic ROS: negative ENT ROS: positive for - sore throat Allergy and Immunology ROS: negative Hematological and Lymphatic ROS: negative Endocrine ROS: negative Respiratory ROS: As in HPI Cardiovascular ROS: As in HPI Gastrointestinal ROS: As in HPI Genito-Urinary ROS: no dysuria, trouble voiding, or hematuria Musculoskeletal ROS: negative Neurological ROS: no TIA or stroke symptoms Dermatological ROS: negative  Physical Examination  Vitals:   05/20/20 1915 05/20/20 2052 05/20/20 2248 05/20/20 2310  BP: (!) 149/79 98/71 (!) 81/50 107/77  Pulse: (!) 137 (!) 118 (!) 107 (!) 113  Resp:  15 15   Temp:      TempSrc:      SpO2: 96% 93% 94% 93%    BP 107/77   Pulse (!) 113   Temp 99.7 F (37.6 C) (Oral)   Resp 15   SpO2 93%   General appearance: alert, cooperative, appears stated age and no distress Head: Normocephalic, without obvious abnormality, atraumatic Eyes: conjunctivae/corneas clear. PERRL, EOM's intact.  Throat: abnormal findings: Tonsillar exudates noted left more than right.  Neck: no adenopathy, no carotid bruit, no JVD, supple, symmetrical, trachea midline and thyroid not enlarged, symmetric, enlarged lymph nodes noted.  No stridor.  Resp: clear to auscultation bilaterally Cardio: S1-S2 is tachycardic regular.  No S3-S4.  No rubs or bruit. GI: soft, non-tender; bowel sounds normal; no masses,  no organomegaly Extremities: extremities normal, atraumatic, no cyanosis or edema Pulses: 2+ and symmetric Skin: Skin color, texture, turgor normal. No rashes or lesions Lymph nodes: Cervical lymphadenopathy appreciated. Neurologic: Alert and oriented x3.  No focal neurological  deficits.    Labs on Admission: I have personally reviewed following labs and imaging studies  CBC: Recent Labs  Lab 05/20/20 1740  WBC 17.0*  NEUTROABS 15.3*  HGB 14.4  HCT 41.8  MCV 86.4  PLT 247   Basic Metabolic Panel: Recent Labs  Lab 05/20/20 1740  NA 135  K 3.6  CL 98  CO2 23  GLUCOSE 132*  BUN 10  CREATININE 1.08*  CALCIUM 9.1   GFR: CrCl cannot be calculated (Unknown ideal weight.). Liver Function Tests: Recent Labs  Lab 05/20/20 1740  AST 31  ALT 42  ALKPHOS 58  BILITOT 3.6*  PROT 8.5*  ALBUMIN 4.6     Radiological Exams on Admission: CT Soft Tissue Neck W Contrast  Result Date: 05/20/2020 CLINICAL DATA:  Initial evaluation for acute severe sore throat, neck swelling, concern for abscess. EXAM: CT NECK WITH CONTRAST TECHNIQUE: Multidetector CT imaging of the neck was performed using the standard protocol following the bolus administration of intravenous contrast. CONTRAST:  75mL OMNIPAQUE IOHEXOL 350 MG/ML SOLN COMPARISON:  None available. FINDINGS: Pharynx and larynx: Oral cavity within normal limits. No acute inflammatory changes seen about the dentition. Palatine tonsils are enlarged and somewhat hyperenhancing bilaterally, suggesting acute tonsillitis. No discrete tonsillar or peritonsillar abscess. Adenoidal soft tissues somewhat prominent and enhancing as well. Mild mucosal edema noted within the adjacent oropharynx. Supraglottic airway remains patent at this time. No retropharyngeal collection or swelling. Epiglottis itself is within normal limits. Remainder of the hypopharynx and supraglottic larynx within normal limits. Glottis normal. Subglottic airway widely patent and clear. Salivary glands: Salivary glands including the parotid and submandibular glands are within normal limits. Thyroid: Thyroid normal. Lymph nodes: Mildly prominent bilateral level II lymph nodes measure up to 1.4 cm, likely reactive. No other enlarged or pathologic adenopathy  within the neck. Vascular: Normal intravascular enhancement seen throughout the neck. Aberrant right subclavian artery noted. Limited intracranial: Probable empty sella noted. Visualized brain and posterior fossa otherwise unremarkable. Visualized orbits: Visualized globes and orbital soft tissues demonstrate no acute finding. Asymmetric axial myopia noted at the right globe. Mastoids and visualized paranasal sinuses: Few small retention cyst noted within the left maxillary sinus. Visualized paranasal sinuses are otherwise clear. Visualize mastoids and middle ear cavities are well pneumatized and free fluid. Skeleton: No acute osseous finding. No discrete or worrisome osseous lesions. Upper chest: Visualized upper chest demonstrates no acute finding. Other: None. IMPRESSION: 1. Findings consistent with acute tonsillitis/pharyngitis as above. No discrete tonsillar or peritonsillar abscess. 2. Mildly prominent bilateral level II lymph nodes, likely reactive. 3. Incidental aberrant right subclavian artery. 4. Suspected empty sella. While this finding is often incidental in nature and of no clinical significance, this can also be seen in the setting of idiopathic intracranial hypertension. Electronically Signed   By: Rise MuBenjamin  McClintock M.D.   On: 05/20/2020 20:29   CT Angio Chest PE W and/or Wo Contrast  Result Date: 05/20/2020 CLINICAL DATA:  Shortness of breath and tachycardia EXAM: CT ANGIOGRAPHY CHEST WITH CONTRAST TECHNIQUE: Multidetector CT imaging of the chest was performed using the standard protocol during bolus administration of  intravenous contrast. Multiplanar CT image reconstructions and MIPs were obtained to evaluate the vascular anatomy. CONTRAST:  60mL OMNIPAQUE IOHEXOL 350 MG/ML SOLN COMPARISON:  None. FINDINGS: Cardiovascular: Aberrant right subclavian artery is noted. Thoracic aorta is otherwise within normal limits. No cardiac enlargement is noted. No coronary calcifications are seen. Pulmonary  artery shows no large central pulmonary embolus. The peripheral branches are somewhat limited due to the timing of the contrast bolus. Mediastinum/Nodes: Thoracic inlet is within normal limits. No hilar or mediastinal adenopathy is noted. The esophagus is within normal limits. Lungs/Pleura: Lungs are hypoinflated similar to that seen on prior chest x-ray. No focal infiltrate or effusion is seen. No pneumothorax is noted. Upper Abdomen: Fatty infiltration of the liver is noted. No other focal abnormality in the upper abdomen is seen. Musculoskeletal: No acute bony abnormality is noted. Review of the MIP images confirms the above findings. IMPRESSION: No evidence of pulmonary embolism is noted. Aberrant right subclavian artery. Hypoinflation of the lung similar to that seen on prior plain film although no focal infiltrate is seen. Fatty liver. Electronically Signed   By: Alcide Clever M.D.   On: 05/20/2020 20:20   DG Chest Portable 1 View  Result Date: 05/20/2020 CLINICAL DATA:  COVID symptoms, fever. EXAM: PORTABLE CHEST 1 VIEW COMPARISON:  None. FINDINGS: The heart size and mediastinal contours are within normal limits. The lung volumes are low. No focal consolidation, pleural effusion, or pneumothorax. The visualized skeletal structures are unremarkable. IMPRESSION: No active disease. Electronically Signed   By: Romona Curls M.D.   On: 05/20/2020 17:49       Problem List  Active Problems:   Acute tonsillitis   Sepsis (HCC)   Hyperbilirubinemia   Assessment: This is a 29 year old female with no significant past medical history who comes in with 3 to 4-day history of fever sore throat.  She is found to have acute severe tonsillitis secondary to strep.  She appears to have sepsis as well.  She will need IV antibiotics and IV fluids for stabilization.  Plan:  1. Acute tonsillitis secondary to strep with sepsis present on admission: Noted to be febrile with a temperature of 103 F, tachycardic with  heart rate greater than 120 with WBC of 17.  She has sepsis secondary to acute tonsillitis.  Sepsis protocol will be initiated.  She is already received IV fluids in the ED.  Will check lactic acid level.  Blood cultures.  Continue with Unasyn for now.  Monitor on telemetry.  Seems to be protecting her airway.  Able to clear her secretions.  Clear liquid diet for now.  CT angiogram did not show any acute abnormalities in the chest.  Influenza and SARS-CoV-2 tests are negative.  2. Hyperbilirubinemia: Noted to have bilirubin of 3.6.  Could be secondary to sepsis.  We will fractionate and continue to trend.  Abdomen is benign.  The upper abdominal cuts on CT angiogram did suggest fatty liver.  3. Incidental finding of suspected empty sella: Outpatient follow-up.   DVT Prophylaxis: Lovenox Code Status: Full code Family Communication: With the patient.  No family at bedside Disposition: Hopefully return home in improved Consults called: None Admission Status: Status is: Inpatient  Remains inpatient appropriate because:IV treatments appropriate due to intensity of illness or inability to take PO and Inpatient level of care appropriate due to severity of illness   Dispo: The patient is from: Home              Anticipated d/c is  to: Home              Anticipated d/c date is: 2 days              Patient currently is not medically stable to d/c.    Severity of Illness: The appropriate patient status for this patient is INPATIENT. Inpatient status is judged to be reasonable and necessary in order to provide the required intensity of service to ensure the patient's safety. The patient's presenting symptoms, physical exam findings, and initial radiographic and laboratory data in the context of their chronic comorbidities is felt to place them at high risk for further clinical deterioration. Furthermore, it is not anticipated that the patient will be medically stable for discharge from the hospital  within 2 midnights of admission. The following factors support the patient status of inpatient.   " The patient's presenting symptoms include throat pain, fever. " The worrisome physical exam findings include acute tonsillitis, tachycardia. " The initial radiographic and laboratory data are worrisome because of elevated WBC elevated bilirubin. " The chronic co-morbidities include none.   * I certify that at the point of admission it is my clinical judgment that the patient will require inpatient hospital care spanning beyond 2 midnights from the point of admission due to high intensity of service, high risk for further deterioration and high frequency of surveillance required.*  Further management decisions will depend on results of further testing and patient's response to treatment.   Amanda Wolfe [employer]  [facility] [employer] on [email or web address]  05/20/2020, 11:45 PM

## 2020-05-21 ENCOUNTER — Encounter (HOSPITAL_COMMUNITY): Payer: Self-pay | Admitting: Internal Medicine

## 2020-05-21 DIAGNOSIS — A419 Sepsis, unspecified organism: Secondary | ICD-10-CM

## 2020-05-21 DIAGNOSIS — R7989 Other specified abnormal findings of blood chemistry: Secondary | ICD-10-CM

## 2020-05-21 DIAGNOSIS — D72829 Elevated white blood cell count, unspecified: Secondary | ICD-10-CM

## 2020-05-21 DIAGNOSIS — R6520 Severe sepsis without septic shock: Secondary | ICD-10-CM

## 2020-05-21 DIAGNOSIS — J0300 Acute streptococcal tonsillitis, unspecified: Secondary | ICD-10-CM

## 2020-05-21 LAB — CBC
HCT: 36 % (ref 36.0–46.0)
Hemoglobin: 12.1 g/dL (ref 12.0–15.0)
MCH: 29.8 pg (ref 26.0–34.0)
MCHC: 33.6 g/dL (ref 30.0–36.0)
MCV: 88.7 fL (ref 80.0–100.0)
Platelets: 184 10*3/uL (ref 150–400)
RBC: 4.06 MIL/uL (ref 3.87–5.11)
RDW: 13.5 % (ref 11.5–15.5)
WBC: 11.1 10*3/uL — ABNORMAL HIGH (ref 4.0–10.5)
nRBC: 0 % (ref 0.0–0.2)

## 2020-05-21 LAB — COMPREHENSIVE METABOLIC PANEL
ALT: 94 U/L — ABNORMAL HIGH (ref 0–44)
AST: 78 U/L — ABNORMAL HIGH (ref 15–41)
Albumin: 3.3 g/dL — ABNORMAL LOW (ref 3.5–5.0)
Alkaline Phosphatase: 53 U/L (ref 38–126)
Anion gap: 8 (ref 5–15)
BUN: 10 mg/dL (ref 6–20)
CO2: 22 mmol/L (ref 22–32)
Calcium: 7.5 mg/dL — ABNORMAL LOW (ref 8.9–10.3)
Chloride: 108 mmol/L (ref 98–111)
Creatinine, Ser: 0.98 mg/dL (ref 0.44–1.00)
GFR, Estimated: >60 mL/min (ref >60)
Glucose, Bld: 114 mg/dL — ABNORMAL HIGH (ref 70–99)
Potassium: 3.5 mmol/L (ref 3.5–5.1)
Sodium: 138 mmol/L (ref 135–145)
Total Bilirubin: 3.6 mg/dL — ABNORMAL HIGH (ref 0.3–1.2)
Total Protein: 6.1 g/dL — ABNORMAL LOW (ref 6.5–8.1)

## 2020-05-21 LAB — URINALYSIS, ROUTINE W REFLEX MICROSCOPIC
Bacteria, UA: NONE SEEN
Bilirubin Urine: NEGATIVE
Glucose, UA: NEGATIVE mg/dL
Hgb urine dipstick: NEGATIVE
Ketones, ur: 5 mg/dL — AB
Nitrite: NEGATIVE
Protein, ur: NEGATIVE mg/dL
Specific Gravity, Urine: 1.042 — ABNORMAL HIGH (ref 1.005–1.030)
pH: 5 (ref 5.0–8.0)

## 2020-05-21 LAB — HIV ANTIBODY (ROUTINE TESTING W REFLEX): HIV Screen 4th Generation wRfx: NONREACTIVE

## 2020-05-21 LAB — PROTIME-INR
INR: 1.5 — ABNORMAL HIGH (ref 0.8–1.2)
Prothrombin Time: 17.7 seconds — ABNORMAL HIGH (ref 11.4–15.2)

## 2020-05-21 LAB — APTT: aPTT: 41 seconds — ABNORMAL HIGH (ref 24–36)

## 2020-05-21 LAB — LACTIC ACID, PLASMA
Lactic Acid, Venous: 2 mmol/L (ref 0.5–1.9)
Lactic Acid, Venous: 2 mmol/L (ref 0.5–1.9)

## 2020-05-21 MED ORDER — SODIUM CHLORIDE 0.9 % IV BOLUS
1000.0000 mL | Freq: Once | INTRAVENOUS | Status: AC
  Administered 2020-05-21: 1000 mL via INTRAVENOUS

## 2020-05-21 MED ORDER — SODIUM CHLORIDE 0.9 % IV SOLN
3.0000 g | Freq: Four times a day (QID) | INTRAVENOUS | Status: DC
  Administered 2020-05-21 – 2020-05-22 (×6): 3 g via INTRAVENOUS
  Filled 2020-05-21: qty 8
  Filled 2020-05-21 (×4): qty 3
  Filled 2020-05-21 (×2): qty 8
  Filled 2020-05-21: qty 3

## 2020-05-21 NOTE — Progress Notes (Signed)
   Vital Signs Blood pressure 109/76, pulse (!) 121, temperature 98.1 F (36.7 C), temperature source Oral, resp. rate 15, SpO2 96 %. PRN medication given Tylenol, Oxycodone and zofran  Gave scheduled Unasyn Antibiotic  MD notified and stated he is waiting on lactic acid draw which is scheduled from Emergency Department - time to be drawn is 145 am. Notified covering provider when results return.   Lab notified this nurse that Lactic acid was 2.0 Provider notified,  ordered 1000 ml bolus - currently infusing and vitals signs taken:  Blood pressure (!) 92/56, pulse (!) 102, temperature 97.8 F (36.6 C), temperature source Oral, resp. rate 15, SpO2 97 %.  Will continue to monitor patient for changes      @MEWSNOTE @       05/21/2020,3:19 AM

## 2020-05-21 NOTE — Progress Notes (Signed)
Patient ID: Amanda Wolfe, female   DOB: Jul 01, 1991, 29 y.o.   MRN: [id number]  PROGRESS NOTE    Amanda Wolfe  LAG:[id number] DOB: [birth year] DOA: 05/20/2020 PCP: [facility]   Brief Narrative:  29 year old female with no past medical history presented with throat pain and fever for 3 days.  On presentation, she was tachycardic and febrile with leukocytosis and evaluation revealed severe tonsillitis without any abscess.  She was started on IV fluids and antibiotics.  Assessment & Plan:   Severe sepsis: Present on admission Acute streptococcal tonsillitis Leukocytosis -Presented with a temperature of 103 F, tachycardic, tachypneic and lactic acid of 2 along with leukocytosis due to streptococcal tonsillitis -Still tachycardic.  Follow cultures.  Currently protecting airway.  Continue Unasyn.  Decrease normal saline to 75 cc an hour. -Flu and Covid testing negative  -WBCs improving  Elevated LFTs -Total bili of 3.6.  AST and ALT are mildly elevated today.  Probably from sepsis.  Repeat a.m. LFTs.  Check hepatitis panel.  If LFTs continue to worsen, will do right upper quadrant ultrasound.  Abdominal exam is currently benign  Incidental finding of suspected empty sella: Outpatient follow-up  Morbid obesity  -outpatient follow-up   DVT prophylaxis: Lovenox Code Status: Full Family Communication: None at bedside Disposition Plan: Status is: Inpatient  Remains inpatient appropriate because:Inpatient level of care appropriate due to severity of illness   Dispo: The patient is from: Home              Anticipated d/c is to: Home              Anticipated d/c date is: 1 day              Patient currently is not medically stable to d/c.  Consultants: None  Procedures: None  Antimicrobials: Unasyn from 05/20/2020 onwards   Subjective: Patient seen and examined at bedside.  She feels slightly better still complains of some sore throat.  No overnight  fever or vomiting reported.  No worsening shortness of breath or cough.  Objective: Vitals:   05/21/20 0525 05/21/20 0903 05/21/20 1028 05/21/20 1100  BP: 94/60 112/65 107/68   Pulse: 100 (!) 112 (!) 121   Resp:   18 (!) 25  Temp: 98.1 F (36.7 C) 98 F (36.7 C) 98.6 F (37 C)   TempSrc: Oral Oral Oral   SpO2: 98% 95% 97%   Weight:      Height:        Intake/Output Summary (Last 24 hours) at 05/21/2020 1204 Last data filed at 05/21/2020 0529 Gross per 24 hour  Intake 3415.43 ml  Output 500 ml  Net 2915.43 ml   Filed Weights   05/21/20 0041  Weight: 97.7 kg    Examination:  General exam: Appears calm and comfortable.  Currently on room air. ENT: Mild neck tenderness present Respiratory system: Bilateral decreased breath sounds at bases with some scattered crackles Cardiovascular system: S1 & S2 heard, tachycardic Gastrointestinal system: Abdomen is morbidly obese, nondistended, soft and nontender. Normal bowel sounds heard. Extremities: No cyanosis, clubbing, edema  Central nervous system: Alert and oriented. No focal neurological deficits. Moving extremities Skin: No rashes, lesions or ulcers Psychiatry: Flat affect   Data Reviewed: I have personally reviewed following labs and imaging studies  CBC: Recent Labs  Lab 05/20/20 1740 05/21/20 0524  WBC 17.0* 11.1*  NEUTROABS 15.3*  --   HGB 14.4 12.1  HCT 41.8 36.0  MCV 86.4 88.7  PLT 247 184   Basic Metabolic Panel: Recent Labs  Lab 05/20/20 1740 05/21/20 0524  NA 135 138  K 3.6 3.5  CL 98 108  CO2 23 22  GLUCOSE 132* 114*  BUN 10 10  CREATININE 1.08* 0.98  CALCIUM 9.1 7.5*   GFR: Estimated Creatinine Clearance: 86.1 mL/min (by C-G formula based on SCr of 0.98 mg/dL). Liver Function Tests: Recent Labs  Lab 05/20/20 1740 05/21/20 0524  AST 31 78*  ALT 42 94*  ALKPHOS 58 53  BILITOT 3.6* 3.6*  PROT 8.5* 6.1*  ALBUMIN 4.6 3.3*   No results for input(s): LIPASE, AMYLASE in the last 168  hours. No results for input(s): AMMONIA in the last 168 hours. Coagulation Profile: Recent Labs  Lab 05/21/20 0222  INR 1.5*   Cardiac Enzymes: No results for input(s): CKTOTAL, CKMB, CKMBINDEX, TROPONINI in the last 168 hours. BNP (last 3 results) No results for input(s): PROBNP in the last 8760 hours. HbA1C: No results for input(s): HGBA1C in the last 72 hours. CBG: No results for input(s): GLUCAP in the last 168 hours. Lipid Profile: No results for input(s): CHOL, HDL, LDLCALC, TRIG, CHOLHDL, LDLDIRECT in the last 72 hours. Thyroid Function Tests: No results for input(s): TSH, T4TOTAL, FREET4, T3FREE, THYROIDAB in the last 72 hours. Anemia Panel: No results for input(s): VITAMINB12, FOLATE, FERRITIN, TIBC, IRON, RETICCTPCT in the last 72 hours. Sepsis Labs: Recent Labs  Lab 05/21/20 0222 05/21/20 0524  LATICACIDVEN 2.0* 2.0*    Recent Results (from the past 240 hour(s))  Respiratory Panel by RT PCR (Flu A&B, Covid) - Nasopharyngeal Swab     Status: None   Collection Time: 05/20/20  5:40 PM   Specimen: Nasopharyngeal Swab  Result Value Ref Range Status   SARS Coronavirus 2 by RT PCR NEGATIVE NEGATIVE Final    Comment: (NOTE) SARS-CoV-2 target nucleic acids are NOT DETECTED.  The SARS-CoV-2 RNA is generally detectable in upper respiratoy specimens during the acute phase of infection. The lowest concentration of SARS-CoV-2 viral copies this assay can detect is 131 copies/mL. A negative result does not preclude SARS-Cov-2 infection and should not be used as the sole basis for treatment or other patient management decisions. A negative result may occur with  improper specimen collection/handling, submission of specimen other than nasopharyngeal swab, presence of viral mutation(s) within the areas targeted by this assay, and inadequate number of viral copies (<131 copies/mL). A negative result must be combined with clinical observations, patient history, and  epidemiological information. The expected result is Negative.  Fact Sheet for Patients:  https://www.moore.com/https://www.fda.gov/media/142436/download  Fact Sheet for Healthcare Providers:  https://www.young.biz/https://www.fda.gov/media/142435/download  This test is no t yet approved or cleared by the [place] FDA and  has been authorized for detection and/or diagnosis of SARS-CoV-2 by FDA under an Emergency Use Authorization (EUA). This EUA will remain  in effect (meaning this test can be used) for the duration of the COVID-19 declaration under Section 564(b)(1) of the Act, 21 U.S.C. section 360bbb-3(b)(1), unless the authorization is terminated or revoked sooner.     Influenza A by PCR NEGATIVE NEGATIVE Final   Influenza B by PCR NEGATIVE NEGATIVE Final    Comment: (NOTE) The Xpert Xpress SARS-CoV-2/FLU/RSV assay is intended as an aid in  the diagnosis of influenza from Nasopharyngeal swab specimens and  should not be used as a sole basis for treatment. Nasal washings and  aspirates are unacceptable for Xpert Xpress SARS-CoV-2/FLU/RSV  testing.  Fact Sheet for Patients: https://www.moore.com/https://www.fda.gov/media/142436/download  Fact Sheet  for Healthcare Providers: https://www.young.biz/  This test is not yet approved or cleared by the [place] and  has been authorized for detection and/or diagnosis of SARS-CoV-2 by  FDA under an Emergency Use Authorization (EUA). This EUA will remain  in effect (meaning this test can be used) for the duration of the  Covid-19 declaration under Section 564(b)(1) of the Act, 21  U.S.C. section 360bbb-3(b)(1), unless the authorization is  terminated or revoked. Performed at [facility], [address], [place], [place] [address]   Group A Strep by PCR     Status: Abnormal   Collection Time: 05/20/20  7:00 PM   Specimen: Throat; Sterile Swab  Result Value Ref Range Status   Group A Strep by PCR DETECTED (A) NOT DETECTED Final    Comment:  Performed at [facility], [address], [place], [place] [address]         Radiology Studies: CT Soft Tissue Neck W Contrast  Result Date: 05/20/2020 CLINICAL DATA:  Initial evaluation for acute severe sore throat, neck swelling, concern for abscess. EXAM: CT NECK WITH CONTRAST TECHNIQUE: Multidetector CT imaging of the neck was performed using the standard protocol following the bolus administration of intravenous contrast. CONTRAST:  42mL OMNIPAQUE IOHEXOL 350 MG/ML SOLN COMPARISON:  None available. FINDINGS: Pharynx and larynx: Oral cavity within normal limits. No acute inflammatory changes seen about the dentition. Palatine tonsils are enlarged and somewhat hyperenhancing bilaterally, suggesting acute tonsillitis. No discrete tonsillar or peritonsillar abscess. Adenoidal soft tissues somewhat prominent and enhancing as well. Mild mucosal edema noted within the adjacent oropharynx. Supraglottic airway remains patent at this time. No retropharyngeal collection or swelling. Epiglottis itself is within normal limits. Remainder of the hypopharynx and supraglottic larynx within normal limits. Glottis normal. Subglottic airway widely patent and clear. Salivary glands: Salivary glands including the parotid and submandibular glands are within normal limits. Thyroid: Thyroid normal. Lymph nodes: Mildly prominent bilateral level II lymph nodes measure up to 1.4 cm, likely reactive. No other enlarged or pathologic adenopathy within the neck. Vascular: Normal intravascular enhancement seen throughout the neck. Aberrant right subclavian artery noted. Limited intracranial: Probable empty sella noted. Visualized brain and posterior fossa otherwise unremarkable. Visualized orbits: Visualized globes and orbital soft tissues demonstrate no acute finding. Asymmetric axial myopia noted at the right globe. Mastoids and visualized paranasal sinuses: Few small retention cyst noted within the left  maxillary sinus. Visualized paranasal sinuses are otherwise clear. Visualize mastoids and middle ear cavities are well pneumatized and free fluid. Skeleton: No acute osseous finding. No discrete or worrisome osseous lesions. Upper chest: Visualized upper chest demonstrates no acute finding. Other: None. IMPRESSION: 1. Findings consistent with acute tonsillitis/pharyngitis as above. No discrete tonsillar or peritonsillar abscess. 2. Mildly prominent bilateral level II lymph nodes, likely reactive. 3. Incidental aberrant right subclavian artery. 4. Suspected empty sella. While this finding is often incidental in nature and of no clinical significance, this can also be seen in the setting of idiopathic intracranial hypertension. Electronically Signed   By: Rise Mu M.D.   On: 05/20/2020 20:29   CT Angio Chest PE W and/or Wo Contrast  Result Date: 05/20/2020 CLINICAL DATA:  Shortness of breath and tachycardia EXAM: CT ANGIOGRAPHY CHEST WITH CONTRAST TECHNIQUE: Multidetector CT imaging of the chest was performed using the standard protocol during bolus administration of intravenous contrast. Multiplanar CT image reconstructions and MIPs were obtained to evaluate the vascular anatomy. CONTRAST:  12mL OMNIPAQUE IOHEXOL 350 MG/ML SOLN COMPARISON:  None. FINDINGS: Cardiovascular: Aberrant right subclavian artery is noted. Thoracic aorta is otherwise within normal limits. No cardiac enlargement is noted. No coronary calcifications are seen. Pulmonary artery shows no large central pulmonary embolus. The peripheral branches are somewhat limited due to the timing of the contrast bolus. Mediastinum/Nodes: Thoracic inlet is within normal limits. No hilar or mediastinal adenopathy is noted. The esophagus is within normal limits. Lungs/Pleura: Lungs are hypoinflated similar to that seen on prior chest x-ray. No focal infiltrate or effusion is seen. No pneumothorax is noted. Upper Abdomen: Fatty infiltration of the  liver is noted. No other focal abnormality in the upper abdomen is seen. Musculoskeletal: No acute bony abnormality is noted. Review of the MIP images confirms the above findings. IMPRESSION: No evidence of pulmonary embolism is noted. Aberrant right subclavian artery. Hypoinflation of the lung similar to that seen on prior plain film although no focal infiltrate is seen. Fatty liver. Electronically Signed   By: Alcide Clever M.D.   On: 05/20/2020 20:20   DG Chest Portable 1 View  Result Date: 05/20/2020 CLINICAL DATA:  COVID symptoms, fever. EXAM: PORTABLE CHEST 1 VIEW COMPARISON:  None. FINDINGS: The heart size and mediastinal contours are within normal limits. The lung volumes are low. No focal consolidation, pleural effusion, or pneumothorax. The visualized skeletal structures are unremarkable. IMPRESSION: No active disease. Electronically Signed   By: Romona Curls M.D.   On: 05/20/2020 17:49        Scheduled Meds: . enoxaparin (LOVENOX) injection  40 mg Subcutaneous Q24H   Continuous Infusions: . sodium chloride 125 mL/hr at 05/21/20 0254  . ampicillin-sulbactam (UNASYN) IV 3 g (05/21/20 3662)          Glade Lloyd, MD [facility] 05/21/2020, 12:04 PM

## 2020-05-21 NOTE — Plan of Care (Signed)
Care plan carried out to have orders complete

## 2020-05-21 NOTE — ED Notes (Signed)
Report given to Colin Rhein, RN.  Pt SBAR information covered at this time.  Receiving nurse has no additional questions.  Pt resting quietly in bed without complaints.  Will continue to monitor.

## 2020-05-21 NOTE — Progress Notes (Signed)
CRITICAL VALUE STICKER  CRITICAL VALUE: LACTIC ACID 2.0  RECEIVER (on-site recipient of call): CALL Xerxes Agrusa RN   DATE & TIME NOTIFIED: 05/21/2020- 0600  MESSENGER (representative from lab):   MD NOTIFIED: K BLOUNT   TIME OF NOTIFICATION:  [id number]   RESPONSE: WAITING FOR RESPONSE/ ORDERS

## 2020-05-22 LAB — CBC WITH DIFFERENTIAL/PLATELET
Abs Immature Granulocytes: 0.04 10*3/uL (ref 0.00–0.07)
Basophils Absolute: 0 10*3/uL (ref 0.0–0.1)
Basophils Relative: 0 %
Eosinophils Absolute: 0.5 10*3/uL (ref 0.0–0.5)
Eosinophils Relative: 5 %
HCT: 35.2 % — ABNORMAL LOW (ref 36.0–46.0)
Hemoglobin: 11.8 g/dL — ABNORMAL LOW (ref 12.0–15.0)
Immature Granulocytes: 0 %
Lymphocytes Relative: 16 %
Lymphs Abs: 1.6 10*3/uL (ref 0.7–4.0)
MCH: 29.2 pg (ref 26.0–34.0)
MCHC: 33.5 g/dL (ref 30.0–36.0)
MCV: 87.1 fL (ref 80.0–100.0)
Monocytes Absolute: 0.6 10*3/uL (ref 0.1–1.0)
Monocytes Relative: 6 %
Neutro Abs: 7.1 10*3/uL (ref 1.7–7.7)
Neutrophils Relative %: 73 %
Platelets: 219 10*3/uL (ref 150–400)
RBC: 4.04 MIL/uL (ref 3.87–5.11)
RDW: 13.8 % (ref 11.5–15.5)
WBC: 9.9 10*3/uL (ref 4.0–10.5)
nRBC: 0 % (ref 0.0–0.2)

## 2020-05-22 LAB — COMPREHENSIVE METABOLIC PANEL
ALT: 154 U/L — ABNORMAL HIGH (ref 0–44)
AST: 107 U/L — ABNORMAL HIGH (ref 15–41)
Albumin: 3.4 g/dL — ABNORMAL LOW (ref 3.5–5.0)
Alkaline Phosphatase: 67 U/L (ref 38–126)
Anion gap: 10 (ref 5–15)
BUN: 6 mg/dL (ref 6–20)
CO2: 22 mmol/L (ref 22–32)
Calcium: 8.5 mg/dL — ABNORMAL LOW (ref 8.9–10.3)
Chloride: 105 mmol/L (ref 98–111)
Creatinine, Ser: 0.82 mg/dL (ref 0.44–1.00)
GFR, Estimated: >60 mL/min (ref >60)
Glucose, Bld: 97 mg/dL (ref 70–99)
Potassium: 3.4 mmol/L — ABNORMAL LOW (ref 3.5–5.1)
Sodium: 137 mmol/L (ref 135–145)
Total Bilirubin: 2 mg/dL — ABNORMAL HIGH (ref 0.3–1.2)
Total Protein: 6.2 g/dL — ABNORMAL LOW (ref 6.5–8.1)

## 2020-05-22 LAB — HEPATITIS PANEL, ACUTE
HCV Ab: NONREACTIVE
Hep A IgM: NONREACTIVE
Hep B C IgM: NONREACTIVE
Hepatitis B Surface Ag: NONREACTIVE

## 2020-05-22 LAB — MAGNESIUM: Magnesium: 2.1 mg/dL (ref 1.7–2.4)

## 2020-05-22 MED ORDER — AMOXICILLIN-POT CLAVULANATE 875-125 MG PO TABS: 1.0000 | ORAL_TABLET | Freq: Two times a day (BID) | ORAL | 0 refills | Status: AC

## 2020-05-22 MED ORDER — POTASSIUM CHLORIDE CRYS ER 20 MEQ PO TBCR
40.0000 meq | EXTENDED_RELEASE_TABLET | Freq: Once | ORAL | Status: AC
  Administered 2020-05-22: 40 meq via ORAL
  Filled 2020-05-22: qty 2

## 2020-05-22 MED ORDER — ONDANSETRON HCL 4 MG PO TABS
4.0000 mg | ORAL_TABLET | Freq: Four times a day (QID) | ORAL | 0 refills | Status: DC | PRN
Start: 2020-05-22 — End: 2020-07-31

## 2020-05-22 MED ORDER — OXYCODONE HCL 5 MG PO TABS: 5.0000 mg | ORAL_TABLET | Freq: Four times a day (QID) | ORAL | 0 refills | Status: AC | PRN

## 2020-05-22 NOTE — Discharge Summary (Addendum)
Physician Discharge Summary  Amanda Wolfe AVW:[id number] DOB: December 23, 1990 DOA: 05/20/2020  PCP: [facility]  Admit date: 05/20/2020 Discharge date: 05/22/2020  Admitted From: Home Disposition: Home  Recommendations for Outpatient Follow-up:  1. Follow up with PCP with repeat CBC/CMP 2. Follow up in ED if symptoms worsen or new appear   Home Health: No Equipment/Devices: None  Discharge Condition: Stable CODE STATUS: Full Diet recommendation: Regular  Brief/Interim Summary: 29 year old female with no past medical history presented with throat pain and fever for 3 days.  On presentation, she was tachycardic and febrile with leukocytosis and evaluation revealed severe tonsillitis without any abscess.  She was started on IV fluids and antibiotics.  During the hospitalization, her diet was gradually advanced.  She was switched to soft diet today which she has tolerated.  She feels much better and wants to go home.  She will be discharged home on oral Augmentin.  Discharge Diagnoses:   Severe sepsis: Present on admission Acute streptococcal tonsillitis Leukocytosis -Presented with a temperature of 103 F, tachycardic, tachypneic and lactic acid of 2 along with leukocytosis due to streptococcal tonsillitis -Blood cultures are negative so far. Currently protecting airway.    Currently on Unasyn.    Treated with IV fluids as well and subsequently discontinued. -Flu and Covid testing negative  -WBCs have normalized.  Sepsis has resolved. -her diet was gradually advanced.  She was switched to soft diet today which she has tolerated.  She feels much better and wants to go home.  She will be discharged home on oral Augmentin for 7 more days.  Follow-up with PCP as an outpatient.  Might need to consider ENT evaluation as an outpatient if patient has recurrent tonsillitis.  Elevated LFTs -Total bili of improving to 2 today. AST and ALT are still slightly elevated.   Probably from sepsis.    Outpatient follow-up of LFTs.  Hepatitis panel pending. Abdominal exam is currently benign  Incidental finding of suspected empty sella: Outpatient follow-up  Morbid obesity  -outpatient follow-up   Discharge Instructions  Discharge Instructions    Diet general   Complete by: As directed    Increase activity slowly   Complete by: As directed      Allergies as of 05/22/2020      Reactions   Sulfa Antibiotics       Medication List    STOP taking these medications   naproxen 500 MG tablet Commonly known as: NAPROSYN     TAKE these medications   amoxicillin-clavulanate 875-125 MG tablet Commonly known as: Augmentin Take 1 tablet by mouth 2 (two) times daily for 7 days.   aspirin-sod bicarb-citric acid 325 MG Tbef tablet Commonly known as: ALKA-SELTZER Take 325 mg by mouth every 6 (six) hours as needed (cold symptoms).   NYQUIL COLD & FLU PO Take 30 mLs by mouth at bedtime as needed (cold symptoms).   ondansetron 4 MG tablet Commonly known as: ZOFRAN Take 1 tablet (4 mg total) by mouth every 6 (six) hours as needed for nausea.   oxyCODONE 5 MG immediate release tablet Commonly known as: Oxy IR/ROXICODONE Take 1 tablet (5 mg total) by mouth every 6 (six) hours as needed for moderate pain.       Follow-up Information    [facility]. Schedule an appointment as soon as possible for a visit in 1 week(s).   Specialty: Family Medicine Contact information: [address] [place] [address] [phone]  Allergies  Allergen Reactions  . Sulfa Antibiotics     Consultations:  None   Procedures/Studies: CT Soft Tissue Neck W Contrast  Result Date: 05/20/2020 CLINICAL DATA:  Initial evaluation for acute severe sore throat, neck swelling, concern for abscess. EXAM: CT NECK WITH CONTRAST TECHNIQUE: Multidetector CT imaging of the neck was performed using the standard  protocol following the bolus administration of intravenous contrast. CONTRAST:  71mL OMNIPAQUE IOHEXOL 350 MG/ML SOLN COMPARISON:  None available. FINDINGS: Pharynx and larynx: Oral cavity within normal limits. No acute inflammatory changes seen about the dentition. Palatine tonsils are enlarged and somewhat hyperenhancing bilaterally, suggesting acute tonsillitis. No discrete tonsillar or peritonsillar abscess. Adenoidal soft tissues somewhat prominent and enhancing as well. Mild mucosal edema noted within the adjacent oropharynx. Supraglottic airway remains patent at this time. No retropharyngeal collection or swelling. Epiglottis itself is within normal limits. Remainder of the hypopharynx and supraglottic larynx within normal limits. Glottis normal. Subglottic airway widely patent and clear. Salivary glands: Salivary glands including the parotid and submandibular glands are within normal limits. Thyroid: Thyroid normal. Lymph nodes: Mildly prominent bilateral level II lymph nodes measure up to 1.4 cm, likely reactive. No other enlarged or pathologic adenopathy within the neck. Vascular: Normal intravascular enhancement seen throughout the neck. Aberrant right subclavian artery noted. Limited intracranial: Probable empty sella noted. Visualized brain and posterior fossa otherwise unremarkable. Visualized orbits: Visualized globes and orbital soft tissues demonstrate no acute finding. Asymmetric axial myopia noted at the right globe. Mastoids and visualized paranasal sinuses: Few small retention cyst noted within the left maxillary sinus. Visualized paranasal sinuses are otherwise clear. Visualize mastoids and middle ear cavities are well pneumatized and free fluid. Skeleton: No acute osseous finding. No discrete or worrisome osseous lesions. Upper chest: Visualized upper chest demonstrates no acute finding. Other: None. IMPRESSION: 1. Findings consistent with acute tonsillitis/pharyngitis as above. No discrete  tonsillar or peritonsillar abscess. 2. Mildly prominent bilateral level II lymph nodes, likely reactive. 3. Incidental aberrant right subclavian artery. 4. Suspected empty sella. While this finding is often incidental in nature and of no clinical significance, this can also be seen in the setting of idiopathic intracranial hypertension. Electronically Signed   By: Rise Mu M.D.   On: 05/20/2020 20:29   CT Angio Chest PE W and/or Wo Contrast  Result Date: 05/20/2020 CLINICAL DATA:  Shortness of breath and tachycardia EXAM: CT ANGIOGRAPHY CHEST WITH CONTRAST TECHNIQUE: Multidetector CT imaging of the chest was performed using the standard protocol during bolus administration of intravenous contrast. Multiplanar CT image reconstructions and MIPs were obtained to evaluate the vascular anatomy. CONTRAST:  44mL OMNIPAQUE IOHEXOL 350 MG/ML SOLN COMPARISON:  None. FINDINGS: Cardiovascular: Aberrant right subclavian artery is noted. Thoracic aorta is otherwise within normal limits. No cardiac enlargement is noted. No coronary calcifications are seen. Pulmonary artery shows no large central pulmonary embolus. The peripheral branches are somewhat limited due to the timing of the contrast bolus. Mediastinum/Nodes: Thoracic inlet is within normal limits. No hilar or mediastinal adenopathy is noted. The esophagus is within normal limits. Lungs/Pleura: Lungs are hypoinflated similar to that seen on prior chest x-ray. No focal infiltrate or effusion is seen. No pneumothorax is noted. Upper Abdomen: Fatty infiltration of the liver is noted. No other focal abnormality in the upper abdomen is seen. Musculoskeletal: No acute bony abnormality is noted. Review of the MIP images confirms the above findings. IMPRESSION: No evidence of pulmonary embolism is noted. Aberrant right subclavian artery. Hypoinflation of the lung similar to  that seen on prior plain film although no focal infiltrate is seen. Fatty liver.  Electronically Signed   By: Alcide Clever M.D.   On: 05/20/2020 20:20   DG Chest Portable 1 View  Result Date: 05/20/2020 CLINICAL DATA:  COVID symptoms, fever. EXAM: PORTABLE CHEST 1 VIEW COMPARISON:  None. FINDINGS: The heart size and mediastinal contours are within normal limits. The lung volumes are low. No focal consolidation, pleural effusion, or pneumothorax. The visualized skeletal structures are unremarkable. IMPRESSION: No active disease. Electronically Signed   By: Romona Curls M.D.   On: 05/20/2020 17:49       Subjective: Patient seen and examined at bedside.  She feels much better and is tolerating diet.  She thinks that if she tolerates some solid food, she will be able to go home today.  No overnight fever, vomiting or worsening neck pain reported.  Discharge Exam: Vitals:   05/22/20 0455 05/22/20 0943  BP: 117/77 121/78  Pulse: (!) 108 98  Resp: 20 16  Temp: 98.3 F (36.8 C) 98.6 F (37 C)  SpO2: 96% 99%    General: Pt is alert, awake, not in acute distress.  Neck tenderness has improved. Cardiovascular: rate controlled, S1/S2 + Respiratory: bilateral decreased breath sounds at bases Abdominal: Soft, morbidly obese, NT, ND, bowel sounds + Extremities: Trace lower extremity edema; no cyanosis    The results of significant diagnostics from this hospitalization (including imaging, microbiology, ancillary and laboratory) are listed below for reference.     Microbiology: Recent Results (from the past 240 hour(s))  Respiratory Panel by RT PCR (Flu A&B, Covid) - Nasopharyngeal Swab     Status: None   Collection Time: 05/20/20  5:40 PM   Specimen: Nasopharyngeal Swab  Result Value Ref Range Status   SARS Coronavirus 2 by RT PCR NEGATIVE NEGATIVE Final    Comment: (NOTE) SARS-CoV-2 target nucleic acids are NOT DETECTED.  The SARS-CoV-2 RNA is generally detectable in upper respiratoy specimens during the acute phase of infection. The lowest concentration of  SARS-CoV-2 viral copies this assay can detect is 131 copies/mL. A negative result does not preclude SARS-Cov-2 infection and should not be used as the sole basis for treatment or other patient management decisions. A negative result may occur with  improper specimen collection/handling, submission of specimen other than nasopharyngeal swab, presence of viral mutation(s) within the areas targeted by this assay, and inadequate number of viral copies (<131 copies/mL). A negative result must be combined with clinical observations, patient history, and epidemiological information. The expected result is Negative.  Fact Sheet for Patients:  https://www.moore.com/  Fact Sheet for Healthcare Providers:  https://www.young.biz/  This test is no t yet approved or cleared by the [place] FDA and  has been authorized for detection and/or diagnosis of SARS-CoV-2 by FDA under an Emergency Use Authorization (EUA). This EUA will remain  in effect (meaning this test can be used) for the duration of the COVID-19 declaration under Section 564(b)(1) of the Act, 21 U.S.C. section 360bbb-3(b)(1), unless the authorization is terminated or revoked sooner.     Influenza A by PCR NEGATIVE NEGATIVE Final   Influenza B by PCR NEGATIVE NEGATIVE Final    Comment: (NOTE) The Xpert Xpress SARS-CoV-2/FLU/RSV assay is intended as an aid in  the diagnosis of influenza from Nasopharyngeal swab specimens and  should not be used as a sole basis for treatment. Nasal washings and  aspirates are unacceptable for Xpert Xpress SARS-CoV-2/FLU/RSV  testing.  Fact Sheet for Patients:  https://www.moore.com/  Fact Sheet for Healthcare Providers: https://www.young.biz/  This test is not yet approved or cleared by the [place] FDA and  has been authorized for detection and/or diagnosis of SARS-CoV-2 by  FDA under an Emergency Use  Authorization (EUA). This EUA will remain  in effect (meaning this test can be used) for the duration of the  Covid-19 declaration under Section 564(b)(1) of the Act, 21  U.S.C. section 360bbb-3(b)(1), unless the authorization is  terminated or revoked. Performed at [facility], [address], [place], [place] [address]   Group A Strep by PCR     Status: Abnormal   Collection Time: 05/20/20  7:00 PM   Specimen: Throat; Sterile Swab  Result Value Ref Range Status   Group A Strep by PCR DETECTED (A) NOT DETECTED Final    Comment: Performed at [facility], [address], [place], [place] [address]  Culture, blood (x 2)     Status: None (Preliminary result)   Collection Time: 05/21/20  2:22 AM   Specimen: BLOOD  Result Value Ref Range Status   Specimen Description   Final    BLOOD RIGHT ANTECUBITAL Performed at [facility], [address], [place], [place] [address]    Special Requests   Final    BOTTLES DRAWN AEROBIC AND ANAEROBIC Blood Culture results may not be optimal due to an excessive volume of blood received in culture bottles Performed at [facility], [address] [place], [place] [address]    Culture   Final    NO GROWTH 1 DAY Performed at [facility], [address] [place], [place] [address]    Report Status PENDING  Incomplete  Culture, blood (x 2)     Status: None (Preliminary result)   Collection Time: 05/21/20  7:14 AM   Specimen: BLOOD  Result Value Ref Range Status   Specimen Description   Final    BLOOD RIGHT ANTECUBITAL Performed at [facility], [address] [place], [place] [address]    Special Requests   Final    BOTTLES DRAWN AEROBIC AND ANAEROBIC Blood Culture adequate volume Performed at [facility], [address] [place], [place] [address]    Culture   Final    NO GROWTH < 24 HOURS Performed at [facility], [address] [place], [place] [address]    Report Status PENDING  Incomplete     Labs: BNP (last 3 results) No results for input(s): BNP in the last 8760 hours. Basic Metabolic Panel: Recent Labs  Lab 05/20/20 1740 05/21/20 0524 05/22/20 0712  NA 135 138 137  K 3.6 3.5 3.4*  CL 98 108 105  CO2 23 22 22   GLUCOSE 132* 114* 97  BUN 10 10 6   CREATININE 1.08* 0.98 0.82  CALCIUM 9.1 7.5* 8.5*  MG  --   --  2.1   Liver Function Tests: Recent Labs  Lab 05/20/20 1740 05/21/20 0524 05/22/20 0712  AST 31 78* 107*  ALT 42 94* 154*  ALKPHOS 58 53 67  BILITOT 3.6* 3.6* 2.0*  PROT 8.5* 6.1* 6.2*  ALBUMIN 4.6 3.3* 3.4*   No results for input(s): LIPASE, AMYLASE in the last 168 hours. No results for input(s): AMMONIA in the last 168 hours. CBC: Recent Labs  Lab 05/20/20 1740 05/21/20 0524 05/22/20 0712  WBC 17.0* 11.1* 9.9  NEUTROABS 15.3*  --  7.1  HGB 14.4 12.1  11.8*  HCT 41.8 36.0 35.2*  MCV 86.4 88.7 87.1  PLT 247 184 219   Cardiac Enzymes: No results for input(s): CKTOTAL, CKMB, CKMBINDEX, TROPONINI in the last 168 hours. BNP: Invalid input(s): POCBNP CBG: No results for input(s): GLUCAP in the last 168 hours. D-Dimer Recent Labs    05/20/20 1740  DDIMER 1.12*   Hgb A1c No results for input(s): HGBA1C in the last 72 hours. Lipid Profile No results for input(s): CHOL, HDL, LDLCALC, TRIG, CHOLHDL, LDLDIRECT in the last 72 hours. Thyroid function studies No results for input(s): TSH, T4TOTAL, T3FREE, THYROIDAB in the last 72 hours.  Invalid input(s): FREET3 Anemia work up No results for input(s): VITAMINB12, FOLATE, FERRITIN, TIBC, IRON, RETICCTPCT in the last 72 hours. Urinalysis    Component Value Date/Time   COLORURINE YELLOW 05/20/2020 2331   APPEARANCEUR HAZY (A) 05/20/2020 2331   LABSPEC 1.042 (H) 05/20/2020 2331   PHURINE 5.0 05/20/2020 2331   GLUCOSEU NEGATIVE 05/20/2020 2331   HGBUR NEGATIVE 05/20/2020 2331   BILIRUBINUR NEGATIVE  05/20/2020 2331   BILIRUBINUR N 12/31/2012 1047   KETONESUR 5 (A) 05/20/2020 2331   PROTEINUR NEGATIVE 05/20/2020 2331   UROBILINOGEN negative 12/31/2012 1047   NITRITE NEGATIVE 05/20/2020 2331   LEUKOCYTESUR TRACE (A) 05/20/2020 2331   Sepsis Labs Invalid input(s): PROCALCITONIN,  WBC,  LACTICIDVEN Microbiology Recent Results (from the past 240 hour(s))  Respiratory Panel by RT PCR (Flu A&B, Covid) - Nasopharyngeal Swab     Status: None   Collection Time: 05/20/20  5:40 PM   Specimen: Nasopharyngeal Swab  Result Value Ref Range Status   SARS Coronavirus 2 by RT PCR NEGATIVE NEGATIVE Final    Comment: (NOTE) SARS-CoV-2 target nucleic acids are NOT DETECTED.  The SARS-CoV-2 RNA is generally detectable in upper respiratoy specimens during the acute phase of infection. The lowest concentration of SARS-CoV-2 viral copies this assay can detect is 131 copies/mL. A negative result does not preclude SARS-Cov-2 infection and should not be used as the sole basis for treatment or other patient management decisions. A negative result may occur with  improper specimen collection/handling, submission of specimen other than nasopharyngeal swab, presence of viral mutation(s) within the areas targeted by this assay, and inadequate number of viral copies (<131 copies/mL). A negative result must be combined with clinical observations, patient history, and epidemiological information. The expected result is Negative.  Fact Sheet for Patients:  https://www.moore.com/  Fact Sheet for Healthcare Providers:  https://www.young.biz/  This test is no t yet approved or cleared by the [place] FDA and  has been authorized for detection and/or diagnosis of SARS-CoV-2 by FDA under an Emergency Use Authorization (EUA). This EUA will remain  in effect (meaning this test can be used) for the duration of the COVID-19 declaration under Section 564(b)(1) of the Act,  21 U.S.C. section 360bbb-3(b)(1), unless the authorization is terminated or revoked sooner.     Influenza A by PCR NEGATIVE NEGATIVE Final   Influenza B by PCR NEGATIVE NEGATIVE Final    Comment: (NOTE) The Xpert Xpress SARS-CoV-2/FLU/RSV assay is intended as an aid in  the diagnosis of influenza from Nasopharyngeal swab specimens and  should not be used as a sole basis for treatment. Nasal washings and  aspirates are unacceptable for Xpert Xpress SARS-CoV-2/FLU/RSV  testing.  Fact Sheet for Patients: https://www.moore.com/  Fact Sheet for Healthcare Providers: https://www.young.biz/  This test is not yet approved or cleared by the [place] FDA and  has been authorized for detection and/or  diagnosis of SARS-CoV-2 by  FDA under an Emergency Use Authorization (EUA). This EUA will remain  in effect (meaning this test can be used) for the duration of the  Covid-19 declaration under Section 564(b)(1) of the Act, 21  U.S.C. section 360bbb-3(b)(1), unless the authorization is  terminated or revoked. Performed at [facility], [address] [place], [place] [address]   Group A Strep by PCR     Status: Abnormal   Collection Time: 05/20/20  7:00 PM   Specimen: Throat; Sterile Swab  Result Value Ref Range Status   Group A Strep by PCR DETECTED (A) NOT DETECTED Final    Comment: Performed at [facility], [address] [place], [place] [address]  Culture, blood (x 2)     Status: None (Preliminary result)   Collection Time: 05/21/20  2:22 AM   Specimen: BLOOD  Result Value Ref Range Status   Specimen Description   Final    BLOOD RIGHT ANTECUBITAL Performed at [facility], [address] [place], [place] [address]    Special Requests   Final    BOTTLES DRAWN AEROBIC AND ANAEROBIC Blood Culture results may not be optimal due to an excessive volume of blood received in culture  bottles Performed at [facility], [address] [place], [place] [address]    Culture   Final    NO GROWTH 1 DAY Performed at [facility], [address] [place], [place] [address]    Report Status PENDING  Incomplete  Culture, blood (x 2)     Status: None (Preliminary result)   Collection Time: 05/21/20  7:14 AM   Specimen: BLOOD  Result Value Ref Range Status   Specimen Description   Final    BLOOD RIGHT ANTECUBITAL Performed at [facility], [address] [place], [place] [address]    Special Requests   Final    BOTTLES DRAWN AEROBIC AND ANAEROBIC Blood Culture adequate volume Performed at [facility], [address] [place], [place] [address]    Culture   Final    NO GROWTH < 24 HOURS Performed at [facility], [address] [place], [place] [address]    Report Status PENDING  Incomplete     Time coordinating discharge: 35 minutes  SIGNED:   Glade LloydKshitiz Zaakirah Kistner, MD  [facility] 05/22/2020, 11:28 AM

## 2020-05-26 LAB — CULTURE, BLOOD (ROUTINE X 2)
Culture: NO GROWTH
Culture: NO GROWTH
Special Requests: ADEQUATE

## 2020-07-18 ENCOUNTER — Emergency Department (HOSPITAL_COMMUNITY)
Admission: EM | Admit: 2020-07-18 | Discharge: 2020-07-18 | Disposition: A | Discharge status: Home / Self Care | Payer: BC Managed Care – PPO | Attending: Emergency Medicine | Admitting: Emergency Medicine

## 2020-07-18 ENCOUNTER — Emergency Department (HOSPITAL_COMMUNITY): Payer: BC Managed Care – PPO

## 2020-07-18 ENCOUNTER — Encounter (HOSPITAL_COMMUNITY): Payer: Self-pay | Admitting: Emergency Medicine

## 2020-07-18 ENCOUNTER — Other Ambulatory Visit: Payer: Self-pay

## 2020-07-18 DIAGNOSIS — R0789 Other chest pain: Secondary | ICD-10-CM | POA: Diagnosis not present

## 2020-07-18 DIAGNOSIS — R9389 Abnormal findings on diagnostic imaging of other specified body structures: Secondary | ICD-10-CM

## 2020-07-18 DIAGNOSIS — M546 Pain in thoracic spine: Secondary | ICD-10-CM | POA: Diagnosis not present

## 2020-07-18 DIAGNOSIS — Z20822 Contact with and (suspected) exposure to covid-19: Secondary | ICD-10-CM | POA: Insufficient documentation

## 2020-07-18 LAB — URINALYSIS, ROUTINE W REFLEX MICROSCOPIC
Bilirubin Urine: NEGATIVE
Glucose, UA: NEGATIVE mg/dL
Hgb urine dipstick: NEGATIVE
Ketones, ur: NEGATIVE mg/dL
Nitrite: NEGATIVE
Protein, ur: NEGATIVE mg/dL
Specific Gravity, Urine: 1.02 (ref 1.005–1.030)
pH: 5 (ref 5.0–8.0)

## 2020-07-18 LAB — BASIC METABOLIC PANEL
Anion gap: 10 (ref 5–15)
BUN: 11 mg/dL (ref 6–20)
CO2: 23 mmol/L (ref 22–32)
Calcium: 9.5 mg/dL (ref 8.9–10.3)
Chloride: 105 mmol/L (ref 98–111)
Creatinine, Ser: 0.93 mg/dL (ref 0.44–1.00)
GFR, Estimated: >60 mL/min (ref >60)
Glucose, Bld: 85 mg/dL (ref 70–99)
Potassium: 3.8 mmol/L (ref 3.5–5.1)
Sodium: 138 mmol/L (ref 135–145)

## 2020-07-18 LAB — CBC WITH DIFFERENTIAL/PLATELET
Abs Immature Granulocytes: 0.03 10*3/uL (ref 0.00–0.07)
Basophils Absolute: 0.1 10*3/uL (ref 0.0–0.1)
Basophils Relative: 1 %
Eosinophils Absolute: 0.3 10*3/uL (ref 0.0–0.5)
Eosinophils Relative: 3 %
HCT: 39.2 % (ref 36.0–46.0)
Hemoglobin: 13.2 g/dL (ref 12.0–15.0)
Immature Granulocytes: 0 %
Lymphocytes Relative: 44 %
Lymphs Abs: 4.2 10*3/uL — ABNORMAL HIGH (ref 0.7–4.0)
MCH: 29 pg (ref 26.0–34.0)
MCHC: 33.7 g/dL (ref 30.0–36.0)
MCV: 86.2 fL (ref 80.0–100.0)
Monocytes Absolute: 0.7 10*3/uL (ref 0.1–1.0)
Monocytes Relative: 7 %
Neutro Abs: 4.3 10*3/uL (ref 1.7–7.7)
Neutrophils Relative %: 45 %
Platelets: 309 10*3/uL (ref 150–400)
RBC: 4.55 MIL/uL (ref 3.87–5.11)
RDW: 12.9 % (ref 11.5–15.5)
WBC: 9.4 10*3/uL (ref 4.0–10.5)
nRBC: 0 % (ref 0.0–0.2)

## 2020-07-18 LAB — RESP PANEL BY RT-PCR (FLU A&B, COVID) ARPGX2
Influenza A by PCR: NEGATIVE
Influenza B by PCR: NEGATIVE
SARS Coronavirus 2 by RT PCR: NEGATIVE

## 2020-07-18 LAB — PREGNANCY, URINE: Preg Test, Ur: NEGATIVE

## 2020-07-18 MED ORDER — DOXYCYCLINE HYCLATE 100 MG PO CAPS: 100.0000 mg | ORAL_CAPSULE | Freq: Two times a day (BID) | ORAL | 0 refills | Status: AC

## 2020-07-18 MED ORDER — OXYCODONE-ACETAMINOPHEN 5-325 MG PO TABS
1.0000 | ORAL_TABLET | Freq: Once | ORAL | Status: AC
  Administered 2020-07-18: 1 via ORAL
  Filled 2020-07-18: qty 1

## 2020-07-18 MED ORDER — IOHEXOL 350 MG/ML SOLN
100.0000 mL | Freq: Once | INTRAVENOUS | Status: AC | PRN
  Administered 2020-07-18: 100 mL via INTRAVENOUS

## 2020-07-18 NOTE — ED Provider Notes (Signed)
  Physical Exam  BP 117/70   Pulse 89   Temp 98.6 F (37 C) (Oral)   Resp 16   Ht 4\' 11"  (1.499 m)   Wt 90.7 kg   LMP 06/26/2020   SpO2 94%   BMI 40.40 kg/m   Physical Exam Gen: appears nontoxic Pulm: spo2 stable on RA  ED Course/Procedures     Procedures  MDM   Pt signed out to me by R Sponsellier, PA-C. Please see previous notes for further history.   In brief, pt presenting for evaluation of back pain. Has been present for several weeks, worsening recently. She is taking ibuprofen, initially had relief but not now. No fever or cough. No n/v/d. She is unvaccinated for covid. No sick contacts. No trauma or injury. No risk factors for PE.   covid negative. cta negative for PE. Discussed findings with pt. As there is abnormal cxr, consider walking pna. Will tx with doxy. Will have pt f/u with pcp. At this time, pt appears safe for d/c. Return precautions given. Pt states she understands and agrees to plan.        14/05/2020, PA-C 07/18/20 1701    09/15/20, MD 07/18/20 2253

## 2020-07-18 NOTE — ED Provider Notes (Signed)
[facility]-EMERGENCY DEPT Provider Note   CSN: [id number] Arrival date & time: 07/18/20  7106     History No chief complaint on file.   Amanda Wolfe is a 30 y.o. female presents with concern for 3 weeks of bilateral mid back pain. Patient states that came on gradually approximately 3 weeks ago; was initially relieved with as needed ibuprofen with significant relief. Patient states that yesterday the pain was worse when she took leftover oxycodone prescription that she had in the past, without relief. Presents today because it is very painful, difficult for her to walk at this time.  Patient denies any dysuria, urinary frequency urgency, hematuria. LMP was 06/26/2020; patient is not currently on any contraceptive, however states that she has not had any intercourse since her last menstrual period. She denies any new vaginal discharge or bleeding. She denies any numbness, tingling, weakness in her lower extremities. Pain does not radiate down her legs, however does radiate around to her ribs. There is no midline pain. Patient states that she has tried stretching, massage, warm compresses without relief.  Patient denies any recent trauma, falls, any new activities or exercises.  Personally reviewed this patient's medical records. She has history of cutaneous lupus erythematosus now in remission. She denies any medications every day.  Patient was admitted to the hospital in 05/2020 for severe tonsillitis, for ministration of IV antibiotics. She was discharged 2 days later. Has not had any other issues since that time.  HPI     Past Medical History:  Diagnosis Date   Cutaneous lupus erythematosus    Dr. Terri Piedra, Dermatology   Wears contact lenses     Patient Active Problem List   Diagnosis Date Noted   Acute tonsillitis 05/20/2020   Sepsis (HCC) 05/20/2020   Hyperbilirubinemia 05/20/2020    History reviewed. No pertinent surgical history.   OB History    No obstetric history on file.     Family History  Problem Relation Age of Onset   Hypertension Father    Stroke Father    Diabetes Maternal Grandfather    Heart disease Neg Hx     Social History   Tobacco Use   Smoking status: Never Smoker   Smokeless tobacco: Never Used  Vaping Use   Vaping Use: Never used  Substance Use Topics   Alcohol use: Yes    Comment: occasional   Drug use: No    Home Medications Prior to Admission medications   Medication Sig Start Date End Date Taking? Authorizing Provider  aspirin-sod bicarb-citric acid (ALKA-SELTZER) 325 MG TBEF tablet Take 325 mg by mouth every 6 (six) hours as needed (cold symptoms).    [provider]  DM-Doxylamine-Acetaminophen (NYQUIL COLD & FLU PO) Take 30 mLs by mouth at bedtime as needed (cold symptoms).    [provider]  ondansetron (ZOFRAN) 4 MG tablet Take 1 tablet (4 mg total) by mouth every 6 (six) hours as needed for nausea. 05/22/20   Glade Lloyd, MD  oxyCODONE (OXY IR/ROXICODONE) 5 MG immediate release tablet Take 1 tablet (5 mg total) by mouth every 6 (six) hours as needed for moderate pain. 05/22/20   Glade Lloyd, MD    Allergies    Sulfa antibiotics  Review of Systems   Review of Systems  Constitutional: Negative for activity change, appetite change, chills, diaphoresis, fatigue and fever.  HENT: Negative.   Eyes: Negative.   Respiratory: Negative for cough, chest tightness, shortness of breath and wheezing.  Cardiovascular: Negative for chest pain, palpitations and leg swelling.  Gastrointestinal: Negative for abdominal pain, blood in stool, constipation, diarrhea, nausea and vomiting.  Genitourinary: Negative for decreased urine volume, dysuria, genital sores, urgency, vaginal bleeding and vaginal discharge.  Musculoskeletal: Positive for back pain.       Bilateral mid-back pain  Skin: Negative.   Neurological: Negative.   Hematological: Negative.    Psychiatric/Behavioral: Negative.     Physical Exam Updated Vital Signs BP 117/70    Pulse 89    Temp 98.6 F (37 C) (Oral)    Resp 16    Ht 4\' 11"  (1.499 m)    Wt 90.7 kg    LMP 06/26/2020    SpO2 94%    BMI 40.40 kg/m   Physical Exam Vitals and nursing note reviewed.  HENT:     Head: Normocephalic and atraumatic.     Nose: Nose normal.     Mouth/Throat:     Mouth: Mucous membranes are moist.     Pharynx: Oropharynx is clear. Uvula midline. No oropharyngeal exudate, posterior oropharyngeal erythema or uvula swelling.     Tonsils: No tonsillar exudate.  Eyes:     General:        Right eye: No discharge.        Left eye: No discharge.     Extraocular Movements: Extraocular movements intact.     Conjunctiva/sclera: Conjunctivae normal.     Pupils: Pupils are equal, round, and reactive to light.  Neck:     Trachea: Trachea and phonation normal.  Cardiovascular:     Rate and Rhythm: Normal rate and regular rhythm.     Pulses: Normal pulses.          Radial pulses are 2+ on the right side and 2+ on the left side.       Dorsalis pedis pulses are 2+ on the right side and 2+ on the left side.     Heart sounds: Normal heart sounds. No murmur heard.   Pulmonary:     Effort: Pulmonary effort is normal. No respiratory distress.     Breath sounds: Examination of the right-lower field reveals decreased breath sounds. Examination of the left-lower field reveals decreased breath sounds. Decreased breath sounds present. No wheezing or rales.  Chest:     Chest wall: No deformity, swelling, tenderness, crepitus or edema.  Abdominal:     General: Bowel sounds are normal. There is no distension.     Tenderness: There is no abdominal tenderness. There is no right CVA tenderness or left CVA tenderness.  Musculoskeletal:        General: No deformity.     Cervical back: Full passive range of motion without pain, normal range of motion and neck supple. No rigidity or crepitus. No pain with  movement, spinous process tenderness or muscular tenderness.     Thoracic back: Tenderness present. No spasms or bony tenderness.     Lumbar back: No spasms, tenderness or bony tenderness.       Back:     Right lower leg: No edema.     Left lower leg: No edema.  Lymphadenopathy:     Cervical: No cervical adenopathy.  Skin:    General: Skin is warm and dry.  Neurological:     General: No focal deficit present.     Mental Status: She is alert and oriented to person, place, and time. Mental status is at baseline.     Sensory: Sensation is intact.  Motor: Motor function is intact.     Gait: Gait is intact.     Deep Tendon Reflexes:     Reflex Scores:      Patellar reflexes are 2+ on the right side and 2+ on the left side.      Achilles reflexes are 2+ on the right side and 2+ on the left side. Psychiatric:        Mood and Affect: Mood normal.     ED Results / Procedures / Treatments   Labs (all labs ordered are listed, but only abnormal results are displayed) Labs Reviewed  URINALYSIS, ROUTINE W REFLEX MICROSCOPIC - Abnormal; Notable for the following components:      Result Value   APPearance HAZY (*)    Leukocytes,Ua SMALL (*)    Bacteria, UA RARE (*)    All other components within normal limits  RESP PANEL BY RT-PCR (FLU A&B, COVID) ARPGX2  PREGNANCY, URINE  BASIC METABOLIC PANEL  CBC WITH DIFFERENTIAL/PLATELET  POC URINE PREG, ED    EKG None  Radiology DG Chest 2 View  Result Date: 07/18/2020 CLINICAL DATA:  30 year old female with history of middle back pain for the past 3 weeks. EXAM: CHEST - 2 VIEW COMPARISON:  Chest x-ray 05/20/2020. FINDINGS: Lung volumes are low. Patchy ill-defined opacities in the left lung base, concerning for potential bronchopneumonia. Right lung appears clear. No pneumothorax. No pleural effusions. No evidence of pulmonary edema. Heart size is normal. Upper mediastinal contours are within normal limits. IMPRESSION: 1. Probable left lower  lobe bronchopneumonia. Followup PA and lateral chest X-ray is recommended in 3-4 weeks following trial of antibiotic therapy to ensure resolution and exclude underlying malignancy. Electronically Signed   By: Trudie Reed M.D.   On: 07/18/2020 13:42    Procedures Procedures (including critical care time)  Medications Ordered in ED Medications  oxyCODONE-acetaminophen (PERCOCET/ROXICET) 5-325 MG per tablet 1 tablet (has no administration in time range)    ED Course  I have reviewed the triage vital signs and the nursing notes.  Pertinent labs & imaging results that were available during my care of the patient were reviewed by me and considered in my medical decision making (see chart for details).    MDM Rules/Calculators/A&P                         30 year old female presents with concern for 3 weeks of progressively worsening bilateral thoracic back pain.  Differential diagnosis patient at this include but not limited to pneumonia, muscular strain, PE, sciatica, degenerative joint disease, epidural abscess/hemorrhage, cauda equina syndrome, transverse myelitis, kidney stone, pyelonephritis pregnancy/ectopic pregnancy,pancreatitis.   Vital signs are normal on intake.  Physical exam significant for decreased breath sounds in lung bases bilaterally (poor effort by patient due to discomfort), tenderness palpation of the thoracic paraspinous musculature bilaterally, L>R.  Patient neurovascularly intact in all 4 extremities bilaterally.  Will proceed with UA, urine pregnancy, chest x-ray.  Analgesia offered.  UA with small leukocytes, rare bacteria.  Asymptomatic bacteriuria, will not proceed with treatment at this time.  Chest x-ray with probable left lower lobe bronchopneumonia.  Radiologist recommending PA and lateral chest x-ray for follow-up in 3 to 4 weeks after trial of antibiotic therapy, and to rule out malignancy.  Given abnormal chest x-ray we will proceed with basic  laboratory studies, and CTA of the chest.  Amanda Wolfe voiced understanding of her medical evaluation and treatment plan thus far; she is amenable to plan  for CTA.  Care this patient was signed out to oncoming ED provider Sophia Caccavale, PA-C.  All pertinent physical exam and laboratory findings were reviewed in detail with the oncoming provider.  I appreciate her collaboration of care of this patient.  This chart was dictated using voice recognition software, Dragon. Despite the best efforts of this provider to proofread and correct errors, errors may still occur which can change documentation meaning.  Final Clinical Impression(s) / ED Diagnoses Final diagnoses:  None    Rx / DC Orders ED Discharge Orders    None       Sherrilee Gilles 07/18/20 1510    Terrilee Files, MD 07/19/20 1030

## 2020-07-18 NOTE — Discharge Instructions (Signed)
Your work up was overall reassuring. There was no PE.  Your chest xray showed a possible pneumonia. As such, take antibiotics as prescribed.  Take the entire course, even if symptoms improve. Take ibuprofen 3 times a day with meals as needed for pain.  Do not take other anti-inflammatories at the same time (Advil, Motrin, naproxen, Aleve). You may supplement with Tylenol if you need further pain control. Follow-up with your primary care doctor for recheck of your symptoms and repeat x-ray as needed. Return to the emergency room with any new or worsening, concerning symptoms

## 2020-07-18 NOTE — ED Triage Notes (Signed)
States 'upper middle back pain on both sides' x3 weeks. Denies trauma, lifting heavy things or falling. Has been taking advil, tried stretching, massages and warm compresses w/o relief. Denies urinary issues, N/V/D.

## 2020-07-31 ENCOUNTER — Ambulatory Visit (HOSPITAL_COMMUNITY)
Admission: EM | Admit: 2020-07-31 | Discharge: 2020-07-31 | Disposition: A | Discharge status: Home / Self Care | Payer: BC Managed Care – PPO | Attending: Emergency Medicine | Admitting: Emergency Medicine

## 2020-07-31 ENCOUNTER — Encounter (HOSPITAL_COMMUNITY): Payer: Self-pay

## 2020-07-31 ENCOUNTER — Encounter (HOSPITAL_COMMUNITY): Payer: Self-pay | Admitting: Emergency Medicine

## 2020-07-31 ENCOUNTER — Other Ambulatory Visit: Payer: Self-pay

## 2020-07-31 ENCOUNTER — Emergency Department (HOSPITAL_COMMUNITY)
Admission: EM | Admit: 2020-07-31 | Discharge: 2020-07-31 | Disposition: A | Discharge status: Home / Self Care | Payer: BC Managed Care – PPO | Attending: Emergency Medicine | Admitting: Emergency Medicine

## 2020-07-31 DIAGNOSIS — M546 Pain in thoracic spine: Secondary | ICD-10-CM | POA: Insufficient documentation

## 2020-07-31 DIAGNOSIS — Z5321 Procedure and treatment not carried out due to patient leaving prior to being seen by health care provider: Secondary | ICD-10-CM | POA: Diagnosis not present

## 2020-07-31 DIAGNOSIS — Z3202 Encounter for pregnancy test, result negative: Secondary | ICD-10-CM | POA: Diagnosis not present

## 2020-07-31 DIAGNOSIS — M545 Low back pain, unspecified: Secondary | ICD-10-CM

## 2020-07-31 LAB — POCT URINALYSIS DIPSTICK, ED / UC
Bilirubin Urine: NEGATIVE
Glucose, UA: NEGATIVE mg/dL
Hgb urine dipstick: NEGATIVE
Ketones, ur: NEGATIVE mg/dL
Leukocytes,Ua: NEGATIVE
Nitrite: NEGATIVE
Protein, ur: NEGATIVE mg/dL
Specific Gravity, Urine: >=1.03 (ref 1.005–1.030)
Urobilinogen, UA: 0.2 mg/dL (ref 0.0–1.0)
pH: 5 (ref 5.0–8.0)

## 2020-07-31 LAB — POC URINE PREG, ED: Preg Test, Ur: NEGATIVE

## 2020-07-31 MED ORDER — IBUPROFEN 600 MG PO TABS: 600.0000 mg | ORAL_TABLET | Freq: Four times a day (QID) | ORAL | 0 refills | Status: AC | PRN

## 2020-07-31 MED ORDER — METHOCARBAMOL 500 MG PO TABS: 500.0000 mg | ORAL_TABLET | Freq: Two times a day (BID) | ORAL | 0 refills | Status: AC

## 2020-07-31 NOTE — ED Provider Notes (Signed)
[facility]    CSN: [id number] Arrival date & time: 07/31/20  1340      History   Chief Complaint Chief Complaint  Patient presents with  . Back Pain    X 6 weeks    HPI Amanda Wolfe is a 30 y.o. female.   Patient presents with 6 weeks of pain in her bilateral mid to lower back.  No falls or injury.  She states the pain feels like a muscle spasm.  It is worse with movement and twisting; improves with rest and lying down; nonradiating.  She denies numbness, tingling, weakness, loss of bowel/bladder control, saddle anesthesia.  She denies fever, chills, chest pain, shortness of breath, cough, abdominal pain, dysuria, pelvic pain, or other symptoms.  Patient went to the [facility] ED this morning but left before being seen.  Patient was seen at [facility], ED on 07/18/2020; diagnosed with bilateral thoracic back pain, abnormal chest x-ray; treated with doxycycline and instructed to follow-up with her PCP.  Her medical history includes cutaneous lupus erythematosus.  The history is provided by the patient and medical records.    Past Medical History:  Diagnosis Date  . Cutaneous lupus erythematosus    Dr. Terri Piedra, Dermatology  . Wears contact lenses     Patient Active Problem List   Diagnosis Date Noted  . Acute tonsillitis 05/20/2020  . Sepsis (HCC) 05/20/2020  . Hyperbilirubinemia 05/20/2020    History reviewed. No pertinent surgical history.  OB History   No obstetric history on file.      Home Medications    Prior to Admission medications   Medication Sig Start Date End Date Taking? Authorizing Provider  ibuprofen (ADVIL) 600 MG tablet Take 1 tablet (600 mg total) by mouth every 6 (six) hours as needed. 07/31/20  Yes Mickie Bail, NP  methocarbamol (ROBAXIN) 500 MG tablet Take 1 tablet (500 mg total) by mouth 2 (two) times daily. 07/31/20  Yes Mickie Bail, NP  oxyCODONE (OXY IR/ROXICODONE) 5 MG immediate release tablet Take 1 tablet (5 mg total) by mouth every  6 (six) hours as needed for moderate pain. 05/22/20  Yes Glade Lloyd, MD  DM-Doxylamine-Acetaminophen (NYQUIL COLD & FLU PO) Take 30 mLs by mouth at bedtime as needed (cold symptoms).    [provider]    Family History Family History  Problem Relation Age of Onset  . Hypertension Father   . Stroke Father   . Diabetes Maternal Grandfather   . Heart disease Neg Hx     Social History Social History   Tobacco Use  . Smoking status: Never Smoker  . Smokeless tobacco: Never Used  Vaping Use  . Vaping Use: Never used  Substance Use Topics  . Alcohol use: Yes    Comment: occasional  . Drug use: No     Allergies   Sulfa antibiotics   Review of Systems Review of Systems  Constitutional: Negative for chills and fever.  HENT: Negative for ear pain and sore throat.   Eyes: Negative for pain and visual disturbance.  Respiratory: Negative for cough and shortness of breath.   Cardiovascular: Negative for chest pain and palpitations.  Gastrointestinal: Negative for abdominal pain and vomiting.  Genitourinary: Negative for dysuria and hematuria.  Musculoskeletal: Positive for back pain. Negative for arthralgias.  Skin: Negative for color change and rash.  Neurological: Negative for seizures, syncope, weakness and numbness.  All other systems reviewed and are negative.    Physical Exam Triage Vital  Signs ED Triage Vitals  Enc Vitals Group     BP      Pulse      Resp      Temp      Temp src      SpO2      Weight      Height      Head Circumference      Peak Flow      Pain Score      Pain Loc      Pain Edu?      Excl. in GC?    No data found.  Updated Vital Signs BP 120/75 (BP Location: Right Arm)   Pulse 85   Temp 98 F (36.7 C) (Oral)   Resp 18   SpO2 100%   Visual Acuity Right Eye Distance:   Left Eye Distance:   Bilateral Distance:    Right Eye Near:   Left Eye Near:    Bilateral Near:     Physical Exam Vitals and nursing note  reviewed.  Constitutional:      General: She is not in acute distress.    Appearance: She is well-developed and well-nourished. She is obese. She is not ill-appearing.  HENT:     Head: Normocephalic and atraumatic.     Mouth/Throat:     Mouth: Mucous membranes are moist.  Eyes:     Conjunctiva/sclera: Conjunctivae normal.  Cardiovascular:     Rate and Rhythm: Normal rate and regular rhythm.     Heart sounds: Normal heart sounds.  Pulmonary:     Effort: Pulmonary effort is normal. No respiratory distress.     Breath sounds: Normal breath sounds. No wheezing or rhonchi.  Abdominal:     General: Bowel sounds are normal.     Palpations: Abdomen is soft.     Tenderness: There is no abdominal tenderness. There is no right CVA tenderness, left CVA tenderness, guarding or rebound.  Musculoskeletal:        General: No swelling, tenderness, deformity or edema. Normal range of motion.     Cervical back: Neck supple.       Back:  Skin:    General: Skin is warm and dry.     Findings: No bruising, erythema, lesion or rash.  Neurological:     General: No focal deficit present.     Mental Status: She is alert and oriented to person, place, and time.     Sensory: No sensory deficit.     Motor: No weakness.     Gait: Gait normal.     Comments: Negative straight leg raise.  Psychiatric:        Mood and Affect: Mood and affect and mood normal.        Behavior: Behavior normal.      UC Treatments / Results  Labs (all labs ordered are listed, but only abnormal results are displayed) Labs Reviewed  POCT URINALYSIS DIPSTICK, ED / UC  POC URINE PREG, ED    EKG   Radiology No results found.  Procedures Procedures (including critical care time)  Medications Ordered in UC Medications - No data to display  Initial Impression / Assessment and Plan / UC Course  I have reviewed the triage vital signs and the nursing notes.  Pertinent labs & imaging results that were available during  my care of the patient were reviewed by me and considered in my medical decision making (see chart for details).   Acute thoracic and low back  pain.  Urine normal.  Urine pregnancy negative.  Treating with ibuprofen and methocarbamol.  Precautions for drowsiness with muscle relaxer discussed.  Instructed patient to follow-up with her PCP as scheduled; she states she has an appointment in 2 weeks.  Instructed her to follow-up with orthopedics if her back pain is not improving.  She agrees to plan of care.   Final Clinical Impressions(s) / UC Diagnoses   Final diagnoses:  Acute bilateral thoracic back pain  Acute bilateral low back pain without sciatica     Discharge Instructions     Take the prescribed ibuprofen as needed for your pain.  Take the muscle relaxer as needed for muscle spasm; Do not drive, operate machinery, or drink alcohol with this medication as it may make you drowsy.    Follow up with your primary care provider or an orthopedist if your pain is not improving.        ED Prescriptions    Medication Sig Dispense Auth. Provider   ibuprofen (ADVIL) 600 MG tablet Take 1 tablet (600 mg total) by mouth every 6 (six) hours as needed. 30 tablet Mickie Bail, NP   methocarbamol (ROBAXIN) 500 MG tablet Take 1 tablet (500 mg total) by mouth 2 (two) times daily. 20 tablet Mickie Bail, NP     I have reviewed the PDMP during this encounter.   Mickie Bail, NP 07/31/20 [phone]

## 2020-07-31 NOTE — ED Notes (Signed)
Pt at [facility].  They called needing pt out of computer.  Didn't tell anyone she was leaving.

## 2020-07-31 NOTE — Discharge Instructions (Addendum)
Take the prescribed ibuprofen as needed for your pain.  Take the muscle relaxer as needed for muscle spasm; Do not drive, operate machinery, or drink alcohol with this medication as it may make you drowsy.    Follow up with your primary care provider or an orthopedist if your pain is not improving.       

## 2020-07-31 NOTE — ED Triage Notes (Addendum)
Pt reports thoracic back pain/spasms x 6 weeks.  States she was seen 1/2 and diagnosed with pneumonia.  Denies SOB.

## 2020-07-31 NOTE — ED Triage Notes (Signed)
Patient complains of back pain x 6 weeks with no relief after antibiotics and multiple visits. Pt is aox4 and ambulatory.

## 2020-08-01 ENCOUNTER — Encounter (HOSPITAL_COMMUNITY): Payer: Self-pay | Admitting: Obstetrics and Gynecology

## 2020-08-01 ENCOUNTER — Other Ambulatory Visit: Payer: Self-pay

## 2020-08-01 ENCOUNTER — Emergency Department (HOSPITAL_COMMUNITY)
Admission: EM | Admit: 2020-08-01 | Discharge: 2020-08-01 | Disposition: A | Discharge status: Home / Self Care | Payer: BC Managed Care – PPO | Attending: Emergency Medicine | Admitting: Emergency Medicine

## 2020-08-01 ENCOUNTER — Emergency Department (HOSPITAL_COMMUNITY): Payer: BC Managed Care – PPO

## 2020-08-01 DIAGNOSIS — M546 Pain in thoracic spine: Secondary | ICD-10-CM | POA: Diagnosis not present

## 2020-08-01 DIAGNOSIS — M545 Low back pain, unspecified: Secondary | ICD-10-CM | POA: Diagnosis not present

## 2020-08-01 DIAGNOSIS — R1013 Epigastric pain: Secondary | ICD-10-CM | POA: Insufficient documentation

## 2020-08-01 LAB — URINALYSIS, ROUTINE W REFLEX MICROSCOPIC
Glucose, UA: NEGATIVE mg/dL
Hgb urine dipstick: NEGATIVE
Ketones, ur: 20 mg/dL — AB
Leukocytes,Ua: NEGATIVE
Nitrite: NEGATIVE
Protein, ur: NEGATIVE mg/dL
Specific Gravity, Urine: 1.029 (ref 1.005–1.030)
pH: 5 (ref 5.0–8.0)

## 2020-08-01 LAB — CBC
HCT: 40.5 % (ref 36.0–46.0)
Hemoglobin: 13.5 g/dL (ref 12.0–15.0)
MCH: 29.2 pg (ref 26.0–34.0)
MCHC: 33.3 g/dL (ref 30.0–36.0)
MCV: 87.5 fL (ref 80.0–100.0)
Platelets: 349 10*3/uL (ref 150–400)
RBC: 4.63 MIL/uL (ref 3.87–5.11)
RDW: 12.8 % (ref 11.5–15.5)
WBC: 11 10*3/uL — ABNORMAL HIGH (ref 4.0–10.5)
nRBC: 0 % (ref 0.0–0.2)

## 2020-08-01 LAB — LIPASE, BLOOD: Lipase: 26 U/L (ref 11–51)

## 2020-08-01 LAB — COMPREHENSIVE METABOLIC PANEL
ALT: 21 U/L (ref 0–44)
AST: 17 U/L (ref 15–41)
Albumin: 4.7 g/dL (ref 3.5–5.0)
Alkaline Phosphatase: 47 U/L (ref 38–126)
Anion gap: 8 (ref 5–15)
BUN: 14 mg/dL (ref 6–20)
CO2: 27 mmol/L (ref 22–32)
Calcium: 9.7 mg/dL (ref 8.9–10.3)
Chloride: 103 mmol/L (ref 98–111)
Creatinine, Ser: 0.94 mg/dL (ref 0.44–1.00)
GFR, Estimated: >60 mL/min (ref >60)
Glucose, Bld: 94 mg/dL (ref 70–99)
Potassium: 3.9 mmol/L (ref 3.5–5.1)
Sodium: 138 mmol/L (ref 135–145)
Total Bilirubin: 1.3 mg/dL — ABNORMAL HIGH (ref 0.3–1.2)
Total Protein: 7.9 g/dL (ref 6.5–8.1)

## 2020-08-01 LAB — I-STAT BETA HCG BLOOD, ED (MC, WL, AP ONLY): I-stat hCG, quantitative: <5 m[IU]/mL (ref <5)

## 2020-08-01 MED ORDER — NAPROXEN 500 MG PO TABS: 500.0000 mg | ORAL_TABLET | Freq: Two times a day (BID) | ORAL | 0 refills | Status: AC

## 2020-08-01 MED ORDER — IOHEXOL 300 MG/ML  SOLN
100.0000 mL | Freq: Once | INTRAMUSCULAR | Status: AC | PRN
  Administered 2020-08-01: 100 mL via INTRAVENOUS

## 2020-08-01 MED ORDER — LIDOCAINE 5 % EX PTCH
2.0000 | MEDICATED_PATCH | CUTANEOUS | Status: DC
  Administered 2020-08-01: 2 via TRANSDERMAL
  Filled 2020-08-01: qty 2

## 2020-08-01 MED ORDER — KETOROLAC TROMETHAMINE 30 MG/ML IJ SOLN
15.0000 mg | Freq: Once | INTRAMUSCULAR | Status: AC
  Administered 2020-08-01: 15 mg via INTRAVENOUS
  Filled 2020-08-01: qty 1

## 2020-08-01 MED ORDER — FENTANYL CITRATE (PF) 100 MCG/2ML IJ SOLN
50.0000 ug | Freq: Once | INTRAMUSCULAR | Status: AC
  Administered 2020-08-01: 50 ug via INTRAVENOUS
  Filled 2020-08-01: qty 2

## 2020-08-01 NOTE — ED Triage Notes (Signed)
Patient reportedly went to urgent care, [place], and [facility] yesterday for back pain and abdominal pain.

## 2020-08-01 NOTE — ED Provider Notes (Signed)
[facility]-EMERGENCY DEPT Provider Note   CSN: [id number] Arrival date & time: 08/01/20  1653     History Chief Complaint  Patient presents with  . Back Pain  . Abdominal Pain    Amanda Wolfe is a 30 y.o. female with a past medical history significant for cutaneous lupus erythematous who presents to the ED due to persistent bilateral mid/low back pain x6 weeks.  Patient states she reported to the ED today because her pain worsened. Patient notes pain now radiates from mid back to epigastric region. Admits to occasional alcohol use. No chronic NSAIDs.  Denies associated nausea, vomiting, diarrhea.  Denies fever and chills.  Patient denies injury to mid back region.  Denies overlying rash.  Denies saddle paresthesias, bowel/bladder incontinence, lower extremity numbness/tingling, lower extremity weakness, history of cancer, IV drug use. Denies chest pain and shortness of breath. She was prescribed ibuprofen and Robaxin yesterday at [facility]. Pain is worse with any movement.  Chart reviewed.  Patient was seen at urgent care and [facility] ED yesterday with reassuring work-up.  UA negative for signs of infection and hematuria.  Urine pregnancy test negative.  At [facility] ED, patient had a CBC, CMP, BNP, COVID test and CXR and X-ray of thoracic spine which were normal per the patient (unable to review results in EMR). Patient was also seen on 07/18/2020 for the same complaint.  CTA chest negative for PE.  History obtained from patient and past medical records. No interpreter used during encounter.      Past Medical History:  Diagnosis Date  . Cutaneous lupus erythematosus    Dr. Terri Piedra, Dermatology  . Wears contact lenses     Patient Active Problem List   Diagnosis Date Noted  . Acute tonsillitis 05/20/2020  . Sepsis (HCC) 05/20/2020  . Hyperbilirubinemia 05/20/2020    History reviewed. No pertinent surgical history.   OB History   No obstetric history on file.     Family  History  Problem Relation Age of Onset  . Hypertension Father   . Stroke Father   . Diabetes Maternal Grandfather   . Heart disease Neg Hx     Social History   Tobacco Use  . Smoking status: Never Smoker  . Smokeless tobacco: Never Used  Vaping Use  . Vaping Use: Never used  Substance Use Topics  . Alcohol use: Yes    Comment: occasional  . Drug use: No    Home Medications Prior to Admission medications   Medication Sig Start Date End Date Taking? Authorizing Provider  DM-Doxylamine-Acetaminophen (NYQUIL COLD & FLU PO) Take 30 mLs by mouth at bedtime as needed (cold symptoms).    [provider]  ibuprofen (ADVIL) 600 MG tablet Take 1 tablet (600 mg total) by mouth every 6 (six) hours as needed. 07/31/20   Mickie Bail, NP  methocarbamol (ROBAXIN) 500 MG tablet Take 1 tablet (500 mg total) by mouth 2 (two) times daily. 07/31/20   Mickie Bail, NP  oxyCODONE (OXY IR/ROXICODONE) 5 MG immediate release tablet Take 1 tablet (5 mg total) by mouth every 6 (six) hours as needed for moderate pain. 05/22/20   Glade Lloyd, MD    Allergies    Sulfa antibiotics  Review of Systems   Review of Systems  Constitutional: Negative for chills and fever.  Respiratory: Negative for shortness of breath.   Cardiovascular: Negative for chest pain.  Gastrointestinal: Positive for abdominal pain. Negative for diarrhea, nausea and vomiting.  Genitourinary: Negative  for dysuria and vaginal discharge.  Musculoskeletal: Positive for back pain and myalgias.  All other systems reviewed and are negative.   Physical Exam Updated Vital Signs BP 137/82   Pulse 82   Temp 98.9 F (37.2 C) (Oral)   Resp 20   SpO2 96%   Physical Exam Vitals and nursing note reviewed.  Constitutional:      General: She is not in acute distress.    Appearance: She is not ill-appearing.  HENT:     Head: Normocephalic.  Eyes:     Pupils: Pupils are equal, round, and reactive to light.  Neck:      Comments: No cervical midline tenderness. Cardiovascular:     Rate and Rhythm: Normal rate and regular rhythm.     Pulses: Normal pulses.     Heart sounds: Normal heart sounds. No murmur heard. No friction rub. No gallop.   Pulmonary:     Effort: Pulmonary effort is normal.     Breath sounds: Normal breath sounds.  Abdominal:     General: Abdomen is flat. Bowel sounds are normal. There is no distension.     Palpations: Abdomen is soft.     Tenderness: There is abdominal tenderness. There is no guarding or rebound.     Comments: Mild tenderness in the epigastric region.  No rebound or guarding.  Negative CVA tenderness bilaterally.  Musculoskeletal:     Cervical back: Neck supple.     Comments: No thoracic or lumbar midline tenderness.  Reproducible bilateral thoracic paraspinal tenderness.  No lower extremity edema.  Negative Homan sign bilaterally.  Skin:    General: Skin is warm and dry.  Neurological:     General: No focal deficit present.     Mental Status: She is alert.  Psychiatric:        Mood and Affect: Mood normal.        Behavior: Behavior normal.     ED Results / Procedures / Treatments   Labs (all labs ordered are listed, but only abnormal results are displayed) Labs Reviewed  COMPREHENSIVE METABOLIC PANEL - Abnormal; Notable for the following components:      Result Value   Total Bilirubin 1.3 (*)    All other components within normal limits  CBC - Abnormal; Notable for the following components:   WBC 11.0 (*)    All other components within normal limits  URINALYSIS, ROUTINE W REFLEX MICROSCOPIC - Abnormal; Notable for the following components:   APPearance HAZY (*)    Bilirubin Urine SMALL (*)    Ketones, ur 20 (*)    All other components within normal limits  LIPASE, BLOOD  I-STAT BETA HCG BLOOD, ED ([facility], [facility], [facility] ONLY)    EKG None  Radiology CT ABDOMEN PELVIS W CONTRAST  Result Date: 08/01/2020 CLINICAL DATA:  Back and abdominal pain EXAM: CT  ABDOMEN AND PELVIS WITH CONTRAST TECHNIQUE: Multidetector CT imaging of the abdomen and pelvis was performed using the standard protocol following bolus administration of intravenous contrast. CONTRAST:  100mL OMNIPAQUE IOHEXOL 300 MG/ML  SOLN COMPARISON:  CT 07/18/2020 FINDINGS: Lower chest: Lung bases are clear. Normal heart size. No pericardial effusion. Hepatobiliary: Diffuse hepatic hypoattenuation compatible with hepatic steatosis. Sparing is seen along the gallbladder fossa. No concerning focal liver lesion. Smooth liver surface contour. Normal gallbladder and biliary tree without visible calcified gallstones. Pancreas: No pancreatic ductal dilatation or surrounding inflammatory changes. Spleen: Normal in size. No concerning splenic lesions. Adrenals/Urinary Tract: Normal adrenal glands. Kidneys are normally  located with symmetric enhancement. No suspicious renal lesion, urolithiasis or hydronephrosis. Urinary bladder is largely decompressed at the time of exam and therefore poorly evaluated by CT imaging. No gross bladder abnormality is evident. Stomach/Bowel: Distal esophagus, stomach and duodenum are unremarkable with a normal duodenal sweep across the midline abdomen. No small bowel thickening or dilatation is seen. Normal air-filled appendix courses from the cecal tip. The cecum itself appears slightly displaced towards midline without evidence of obstruction at this level or elsewhere within the large or small bowel. No colonic dilatation or wall thickening. Vascular/Lymphatic: Retroaortic left renal vein. No other significant vascular findings are present. No enlarged abdominal or pelvic lymph nodes. Reproductive: Normal appearance of the uterus and adnexal structures. Other: No abdominopelvic free fluid or free gas. No bowel containing hernias. Musculoskeletal: No acute osseous abnormality or suspicious osseous lesion. IMPRESSION: 1. No acute intra-abdominal process to provide a cause for patient's  symptoms. 2. Hepatic steatosis. Electronically Signed   By: Kreg Shropshire M.D.   On: 08/01/2020 20:51    Procedures Procedures (including critical care time)  Medications Ordered in ED Medications  fentaNYL (SUBLIMAZE) injection 50 mcg (50 mcg Intravenous Given 08/01/20 2044)  iohexol (OMNIPAQUE) 300 MG/ML solution 100 mL (100 mLs Intravenous Contrast Given 08/01/20 2042)    ED Course  I have reviewed the triage vital signs and the nursing notes.  Pertinent labs & imaging results that were available during my care of the patient were reviewed by me and considered in my medical decision making (see chart for details).    MDM Rules/Calculators/A&P                         30 year old female presents the ED due to thoracic back pain and epigastric abdominal pain.  Patient has been seen numerous times for her thoracic back pain with extensive work-up which has been unremarkable (CTA chest, CXR, thoracic x-ray). Patient notes pain began to radiate to her epigastric region today.  Upon arrival, vitals all within normal limits.  Patient in no acute distress and non toxic appearing.  Patient appears extremely uncomfortable in chair.  Physical exam significant for mild epigastric tenderness without rebound or guarding.  Reproducible bilateral thoracic paraspinal tenderness.  No cervical, thoracic, or lumbar midline tenderness.  Lower extremities neurovascularly intact.  Routine labs ordered at triage.  CT abdomen to rule out intra-abdominal abnormalities given severe pain.  Fentanyl given for pain management.  CBC significant for mild leukocytosis at 11.  Lipase normal at 26.  Doubt pancreatitis.  CMP reassuring with normal renal function and no major electrolyte derangements.  Pregnancy test negative.  UA significant for small amount of bilirubin and ketonuria.  CT abdomen personally reviewed which is negative for any acute abnormalities.  Suspect back pain related to MSK etiology.  Advised patient to  continue taking her ibuprofen and Robaxin as prescribed yesterday.  Orthopedics number given to patient at discharge.  Instructed patient to follow-up with orthopedics if symptoms not improve within the next week.  EKG personally reviewed which demonstrates normal sinus rhythm with no signs of acute ischemia. Strict ED precautions discussed with patient. Patient states understanding and agrees to plan. Patient discharged home in no acute distress and stable vitals.  Discussed case with Dr. Particia Nearing who agrees with assessment and plan.  Final Clinical Impression(s) / ED Diagnoses Final diagnoses:  Acute bilateral thoracic back pain  Epigastric pain    Rx / DC Orders ED Discharge Orders  None       Jesusita Oka 08/01/20 2128    Jacalyn Lefevre, MD 08/01/20 2259

## 2020-08-01 NOTE — Discharge Instructions (Addendum)
As discussed, all of your labs are reassuring today.  Your CT abdomen did not show any acute abnormalities that could be causing your pain.  I'm sending you home with naproxen.  Take as needed for pain.  Do not mix with other over-the-counter pain medications.  Continue to take Robaxin as prescribed yesterday.  I have included the number of the orthopedic surgeon.  Call if her symptoms not improve over the next week.  Return to the ER for new or worsening symptoms.

## 2020-08-01 NOTE — ED Notes (Signed)
Patient assisted to restroom using wheelchair. 

## 2021-03-27 IMAGING — CT CT ANGIO CHEST
2 of 6 series · 18 of 36 positions shown · IV contrast (omnipaque)
Comparison: May 20, 2020.

CLINICAL DATA: Chest pain.

EXAM:
CT ANGIOGRAPHY CHEST WITH CONTRAST
TECHNIQUE: Multidetector CT imaging of the chest was performed using the
standard protocol during bolus administration of intravenous
contrast. Multiplanar CT image reconstructions and MIPs were
obtained to evaluate the vascular anatomy.
CONTRAST:  100mL OMNIPAQUE IOHEXOL 350 MG/ML SOLN

[Series 6: thins · axial · 0.68mm/px · z∈[-83,+106]mm · 17 of 213 slices shown]
[im 12/213  lung]
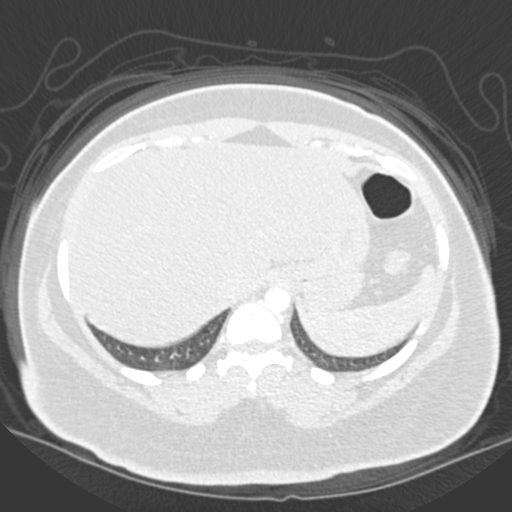
[im 24/213  mediastinal]
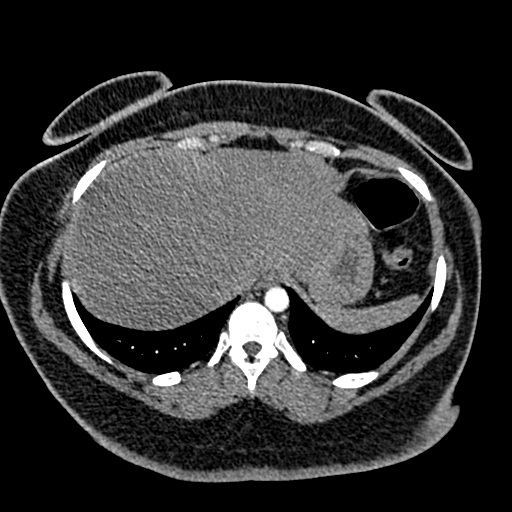
[im 36/213  lung]
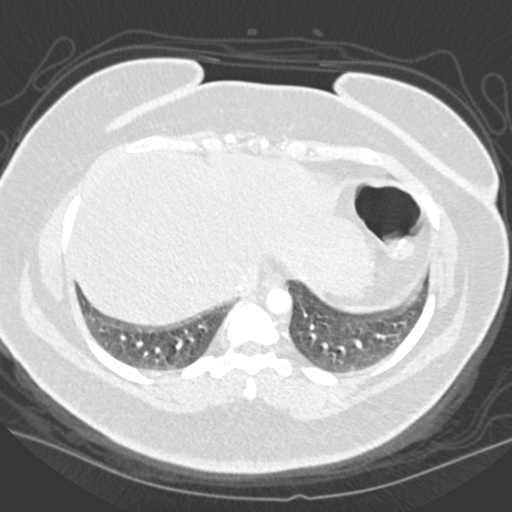
[im 48/213  mediastinal]
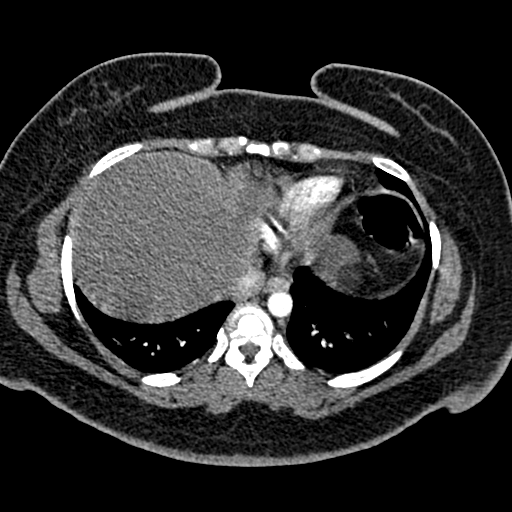
[im 59/213  lung]
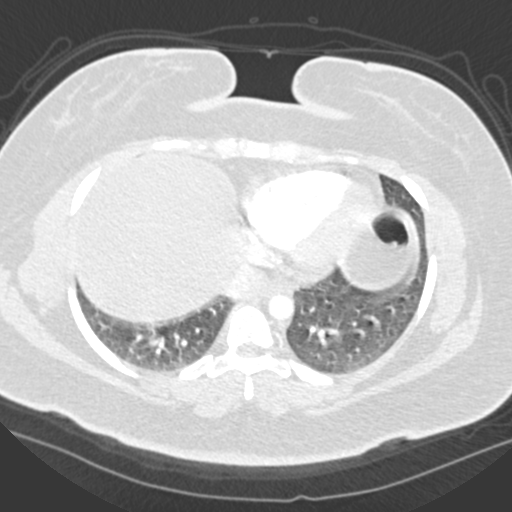
[im 71/213  mediastinal]
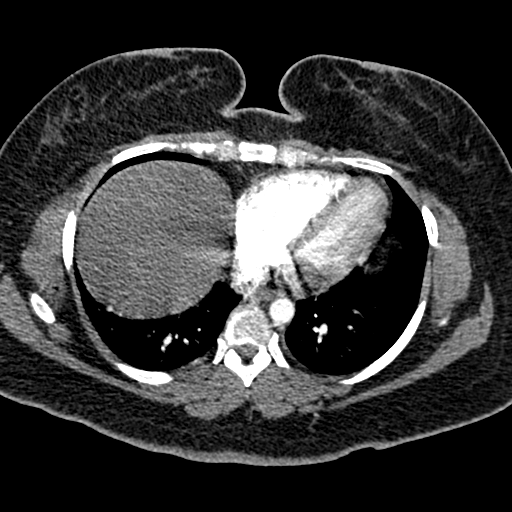
[im 83/213  lung]
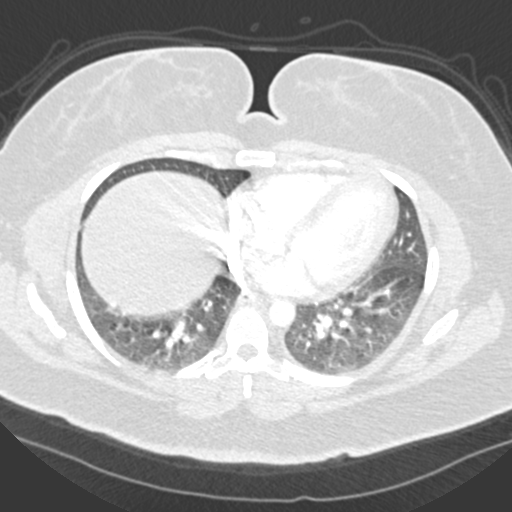
[im 95/213  mediastinal]
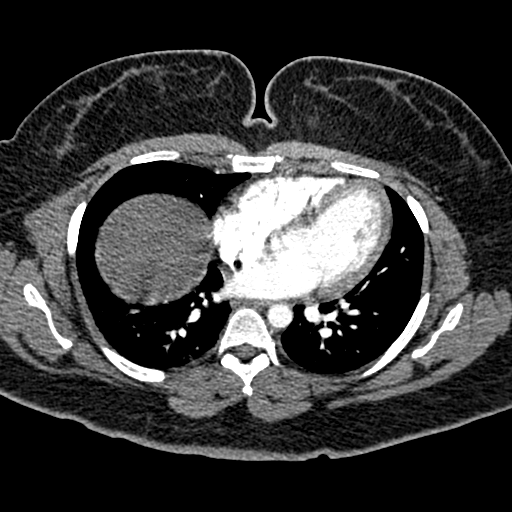
[im 107/213  lung]
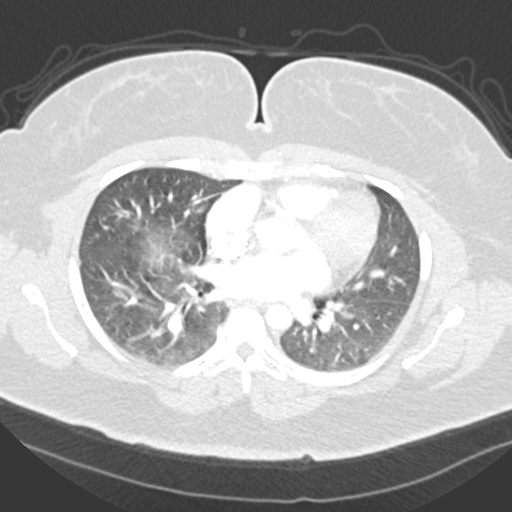
[im 118/213  mediastinal]
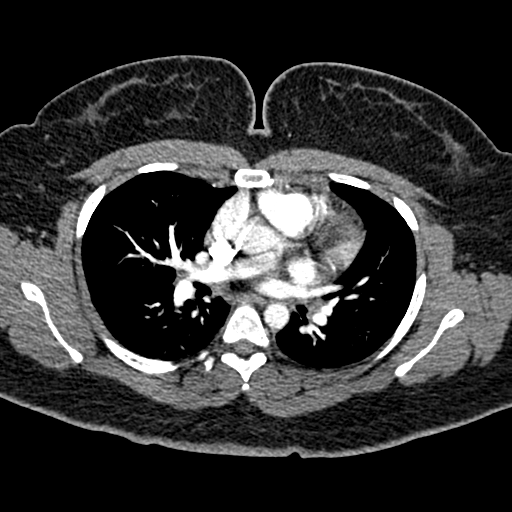
[im 130/213  lung]
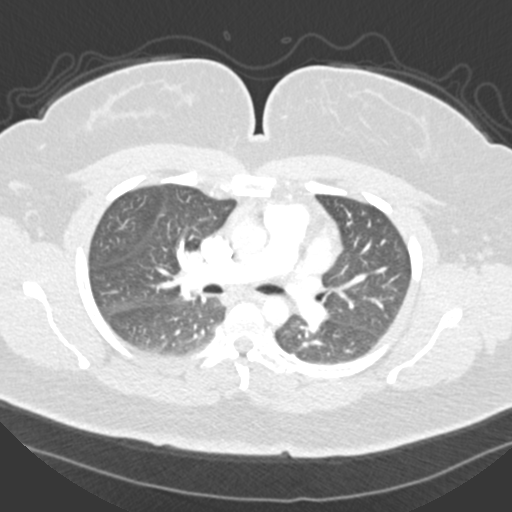
[im 142/213  mediastinal]
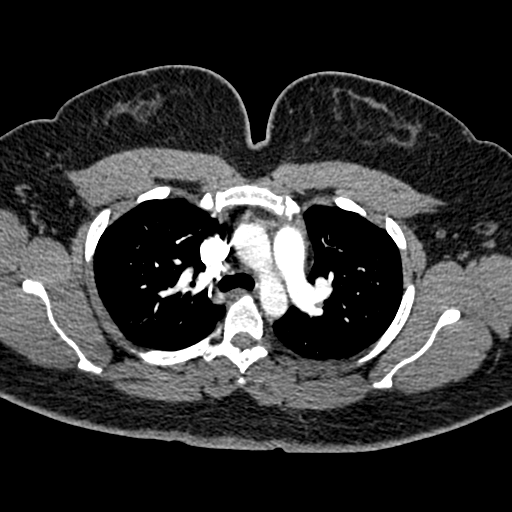
[im 154/213  lung]
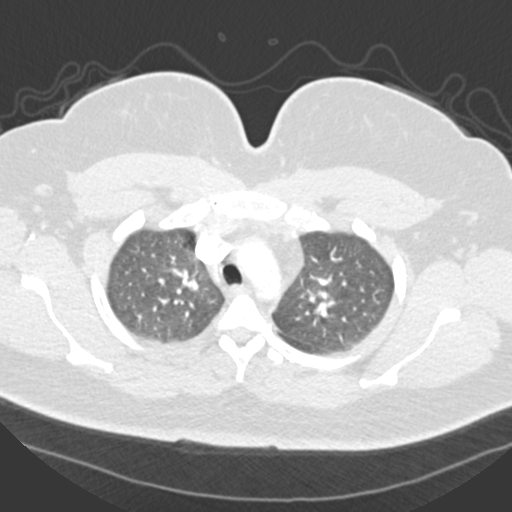
[im 165/213  mediastinal]
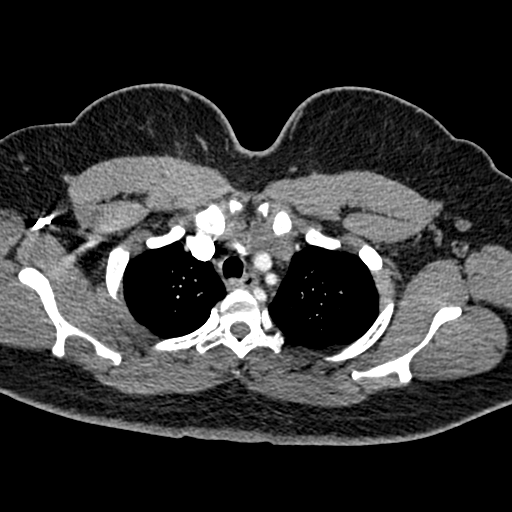
[im 177/213  lung]
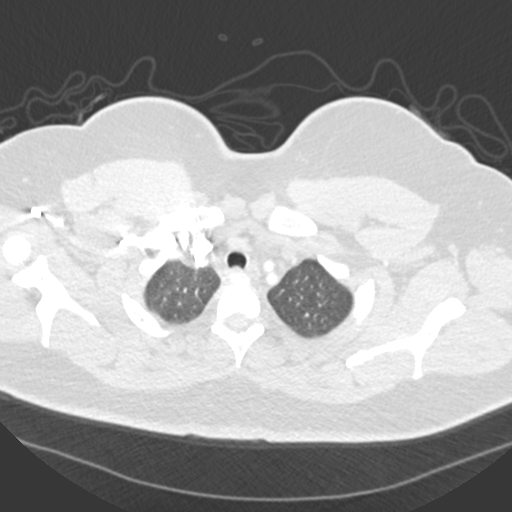
[im 189/213  mediastinal]
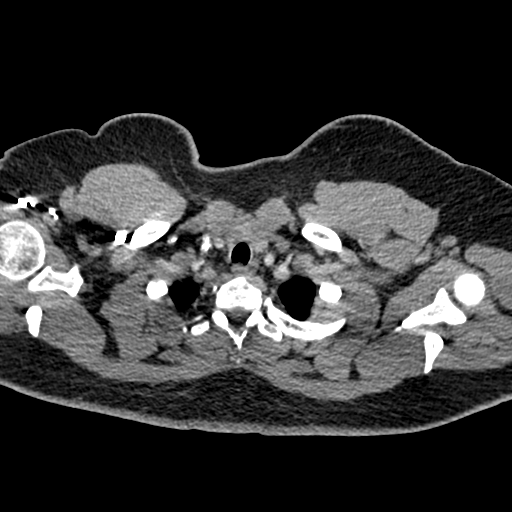
[im 201/213  lung]
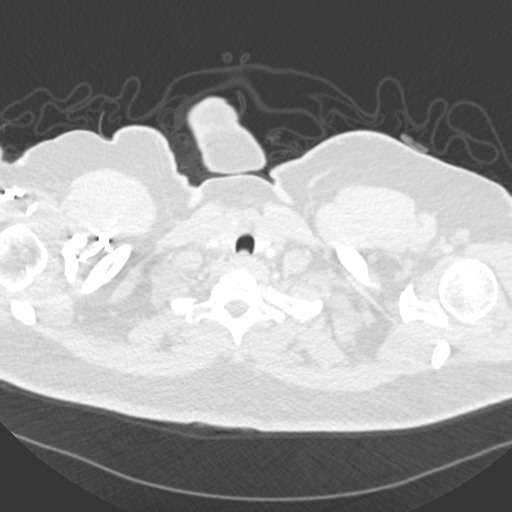

[Series 8: coronal mpr · coronal · 0.42mm/px · 1 of 146 slices shown]
[im 73/146  mediastinal]
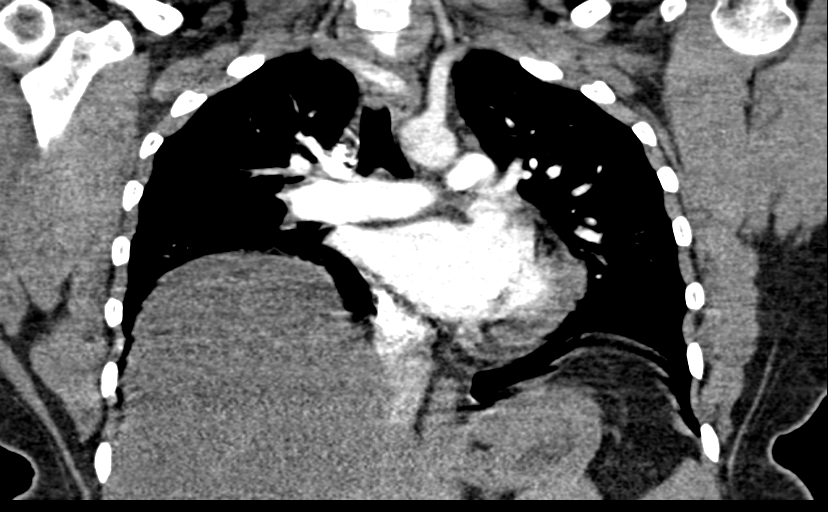

[18 of 36 positions shown; findings below may reference images not displayed]

FINDINGS: Cardiovascular: Satisfactory opacification of the pulmonary arteries
to the segmental level. No evidence of pulmonary embolism. Normal
heart size. No pericardial effusion.

Mediastinum/Nodes: No enlarged mediastinal, hilar, or axillary lymph
nodes. Thyroid gland, trachea, and esophagus demonstrate no
significant findings.

Lungs/Pleura: Lungs are clear. No pleural effusion or pneumothorax.

Upper Abdomen: Hepatic steatosis is noted.

Musculoskeletal: No chest wall abnormality. No acute or significant
osseous findings.

Review of the MIP images confirms the above findings.
IMPRESSION: 1. No definite evidence of pulmonary embolus.
2. Hepatic steatosis.

## 2021-09-02 ENCOUNTER — Other Ambulatory Visit: Payer: Self-pay
